# Patient Record
Sex: Male | Born: 1945 | Race: White | Hispanic: No | Smoking: Former smoker
Health system: Southern US, Community
[De-identification: ages and names within clinical notes are randomized; demographics above are authoritative.]

## PROBLEM LIST (undated history)

## (undated) DIAGNOSIS — I509 Heart failure, unspecified: Secondary | ICD-10-CM

## (undated) DIAGNOSIS — J8410 Pulmonary fibrosis, unspecified: Secondary | ICD-10-CM

## (undated) DIAGNOSIS — I10 Essential (primary) hypertension: Secondary | ICD-10-CM

## (undated) DIAGNOSIS — E785 Hyperlipidemia, unspecified: Secondary | ICD-10-CM

## (undated) DIAGNOSIS — R0602 Shortness of breath: Secondary | ICD-10-CM

## (undated) DIAGNOSIS — I2510 Atherosclerotic heart disease of native coronary artery without angina pectoris: Secondary | ICD-10-CM

## (undated) HISTORY — DX: Essential (primary) hypertension: I10

## (undated) HISTORY — DX: Atherosclerotic heart disease of native coronary artery without angina pectoris: I25.10

## (undated) HISTORY — PX: CARDIAC CATHETERIZATION: SHX172

## (undated) HISTORY — DX: Pulmonary fibrosis, unspecified: J84.10

## (undated) HISTORY — DX: Hyperlipidemia, unspecified: E78.5

---

## 2007-04-08 ENCOUNTER — Ambulatory Visit: Payer: Self-pay | Admitting: Critical Care Medicine

## 2007-04-08 LAB — CONVERTED CEMR LAB
Angiotensin 1 Converting Enzyme: 4 units/L — ABNORMAL LOW (ref 9–67)
Anti Nuclear Antibody(ANA): NEGATIVE
Sed Rate: 30 mm/hr — ABNORMAL HIGH (ref 0–20)

## 2007-04-09 ENCOUNTER — Ambulatory Visit (HOSPITAL_COMMUNITY): Admission: RE | Admit: 2007-04-09 | Discharge: 2007-04-09 | Payer: Self-pay | Admitting: Critical Care Medicine

## 2007-04-14 ENCOUNTER — Ambulatory Visit: Payer: Self-pay | Admitting: Critical Care Medicine

## 2007-04-14 ENCOUNTER — Ambulatory Visit: Admission: RE | Admit: 2007-04-14 | Discharge: 2007-04-14 | Payer: Self-pay | Admitting: Critical Care Medicine

## 2007-04-14 ENCOUNTER — Encounter: Payer: Self-pay | Admitting: Critical Care Medicine

## 2007-04-23 ENCOUNTER — Ambulatory Visit: Payer: Self-pay | Admitting: Critical Care Medicine

## 2007-04-26 DIAGNOSIS — I10 Essential (primary) hypertension: Secondary | ICD-10-CM | POA: Insufficient documentation

## 2007-04-26 DIAGNOSIS — E785 Hyperlipidemia, unspecified: Secondary | ICD-10-CM

## 2007-05-28 ENCOUNTER — Ambulatory Visit: Payer: Self-pay | Admitting: Critical Care Medicine

## 2010-02-20 ENCOUNTER — Encounter: Payer: Self-pay | Admitting: Critical Care Medicine

## 2010-02-26 ENCOUNTER — Ambulatory Visit: Payer: Self-pay | Admitting: Thoracic Surgery (Cardiothoracic Vascular Surgery)

## 2010-03-15 ENCOUNTER — Ambulatory Visit (HOSPITAL_BASED_OUTPATIENT_CLINIC_OR_DEPARTMENT_OTHER): Admission: RE | Admit: 2010-03-15 | Discharge: 2010-03-15 | Payer: Self-pay | Admitting: Critical Care Medicine

## 2010-03-15 ENCOUNTER — Ambulatory Visit: Payer: Self-pay | Admitting: Diagnostic Radiology

## 2010-03-15 ENCOUNTER — Ambulatory Visit: Payer: Self-pay | Admitting: Critical Care Medicine

## 2010-03-15 DIAGNOSIS — J8410 Pulmonary fibrosis, unspecified: Secondary | ICD-10-CM

## 2010-03-15 DIAGNOSIS — I2510 Atherosclerotic heart disease of native coronary artery without angina pectoris: Secondary | ICD-10-CM | POA: Insufficient documentation

## 2010-03-16 ENCOUNTER — Encounter: Payer: Self-pay | Admitting: Critical Care Medicine

## 2010-03-16 ENCOUNTER — Telehealth: Payer: Self-pay | Admitting: Critical Care Medicine

## 2010-03-19 ENCOUNTER — Ambulatory Visit: Payer: Self-pay | Admitting: Critical Care Medicine

## 2010-03-20 ENCOUNTER — Encounter: Payer: Self-pay | Admitting: Critical Care Medicine

## 2010-04-30 ENCOUNTER — Telehealth: Payer: Self-pay | Admitting: Critical Care Medicine

## 2010-05-08 ENCOUNTER — Ambulatory Visit: Payer: Self-pay | Admitting: Internal Medicine

## 2010-05-24 ENCOUNTER — Ambulatory Visit: Payer: Self-pay | Admitting: Internal Medicine

## 2010-06-12 ENCOUNTER — Telehealth: Payer: Self-pay | Admitting: Critical Care Medicine

## 2010-07-05 ENCOUNTER — Ambulatory Visit: Payer: Self-pay | Admitting: Critical Care Medicine

## 2010-07-17 ENCOUNTER — Telehealth (INDEPENDENT_AMBULATORY_CARE_PROVIDER_SITE_OTHER): Payer: Self-pay | Admitting: *Deleted

## 2010-07-23 ENCOUNTER — Telehealth (INDEPENDENT_AMBULATORY_CARE_PROVIDER_SITE_OTHER): Payer: Self-pay | Admitting: *Deleted

## 2010-08-13 ENCOUNTER — Encounter: Payer: Self-pay | Admitting: Critical Care Medicine

## 2010-08-30 ENCOUNTER — Encounter: Payer: Self-pay | Admitting: Internal Medicine

## 2010-09-04 ENCOUNTER — Encounter: Payer: Self-pay | Admitting: Critical Care Medicine

## 2010-09-04 NOTE — Progress Notes (Signed)
Summary: CT Chest Result  Phone Note Outgoing Call   Reason for Call: Discuss lab or test results Summary of Call: I called pt and told him results of the CT Chest I called in 5days of Avelox 400mg  /d to [facility].  Pt is aware Initial call taken by: Storm Frisk MD,  March 16, 2010 2:28 PM

## 2010-09-04 NOTE — Miscellaneous (Signed)
Summary: Orders Update pft charges  Clinical Lists Changes  Orders: Added new Service order of Carbon Monoxide diffusing w/capacity (94720) - Signed Added new Service order of Lung Volumes (94240) - Signed Added new Service order of Spirometry (Pre & Post) (94060) - Signed 

## 2010-09-04 NOTE — Progress Notes (Signed)
Summary: nos appt  Phone Note Call from Patient   Caller: [email or web address]  Call For: wright Summary of Call: LMTCB x2 to rsc nos from 9/22. Initial call taken by: Darletta Moll,  April 30, 2010 10:31 AM

## 2010-09-04 NOTE — Progress Notes (Signed)
Summary: ov needed  Phone Note Outgoing Call   Reason for Call: Confirm/change Appt Summary of Call: needs ov in [place] office soon  Follow-up for Phone Call        Called, spoke with pt.  He was informed per PW, he needs OV soon.  Pt stated he could not do it before dec 1.  OV scheduled with PW on 12.1.11 at 9am in [place] - pt aware.  Gweneth Dimitri RN  June 12, 2010 5:15 PM

## 2010-09-04 NOTE — Miscellaneous (Signed)
Summary: PFTs   Pulmonary Function Test Date: 03/19/2010 Height (in.): 70 Gender: Male  Pre-Spirometry FVC    Value: 1.93 L/min   Pred: 4.49 L/min     % Pred: 43 % FEV1    Value: 1.61 L     Pred: 3.10 L     % Pred: 52 % FEV1/FVC  Value: 84 %     Pred: 70 %    FEF 25-75  Value: 2.17 L/min   Pred: 2.88 L/min     % Pred: 76 %  Post-Spirometry FVC    Value: 1.93 L/min   Pred: 4.49 L/min     % Pred: 43 % FEV1    Value: 1.63 L     Pred: 3.10 L     % Pred: 53 % FEV1/FVC  Value: 85 %     Pred: 70 %    FEF 25-75  Value: 2.19 L/min   Pred: 2.88 L/min     % Pred: 76 %  Lung Volumes TLC    Value: 3.21 L   % Pred: 49 % RV    Value: 1.26 L   % Pred: 51 % DLCO    Value: 12.6 %   % Pred: 48 % DLCO/VA  Value: 3.83 %   % Pred: 106 %  Comments: Moderate restrictive defect Clinical Lists Changes  Observations: Added new observation of PFT COMMENTS: Moderate restrictive defect (03/20/2010 10:30) Added new observation of DLCO/VA%EXP: 106 % (03/20/2010 10:30) Added new observation of DLCO/VA: 3.83 % (03/20/2010 10:30) Added new observation of DLCO % EXPEC: 48 % (03/20/2010 10:30) Added new observation of DLCO: 12.6 % (03/20/2010 10:30) Added new observation of RV % EXPECT: 51 % (03/20/2010 10:30) Added new observation of RV: 1.26 L (03/20/2010 10:30) Added new observation of TLC % EXPECT: 49 % (03/20/2010 10:30) Added new observation of TLC: 3.21 L (03/20/2010 10:30) Added new observation of FEF2575%EXPS: 76 % (03/20/2010 10:30) Added new observation of PSTFEF25/75P: 2.88  (03/20/2010 10:30) Added new observation of PSTFEF25/75%: 2.19 L/min (03/20/2010 10:30) Added new observation of FEV1FVCPRDPS: 70 % (03/20/2010 10:30) Added new observation of PSTFEV1/FVC: 85 % (03/20/2010 10:30) Added new observation of POSTFEV1%PRD: 53 % (03/20/2010 10:30) Added new observation of FEV1PRDPST: 3.10 L (03/20/2010 10:30) Added new observation of POST FEV1: 1.63 L/min (03/20/2010 10:30) Added new observation  of POST FVC%EXP: 43 % (03/20/2010 10:30) Added new observation of FVCPRDPST: 4.49 L/min (03/20/2010 10:30) Added new observation of POST FVC: 1.93 L (03/20/2010 10:30) Added new observation of FEF % EXPEC: 76 % (03/20/2010 10:30) Added new observation of FEF25-75%PRE: 2.88 L/min (03/20/2010 10:30) Added new observation of FEF 25-75%: 2.17 L/min (03/20/2010 10:30) Added new observation of FEV1/FVC PRE: 70 % (03/20/2010 10:30) Added new observation of FEV1/FVC: 84 % (03/20/2010 10:30) Added new observation of FEV1 % EXP: 52 % (03/20/2010 10:30) Added new observation of FEV1 PREDICT: 3.10 L (03/20/2010 10:30) Added new observation of FEV1: 1.61 L (03/20/2010 10:30) Added new observation of FVC % EXPECT: 43 % (03/20/2010 10:30) Added new observation of FVC PREDICT: 4.49 L (03/20/2010 10:30) Added new observation of FVC: 1.93 L (03/20/2010 10:30) Added new observation of PFT HEIGHT: 70  (03/20/2010 10:30) Added new observation of PFT DATE: 03/19/2010  (03/20/2010 10:30)  Appended Document: PFTs result noted  patient aware

## 2010-09-04 NOTE — Assessment & Plan Note (Signed)
Summary: SIX MIN WALK-PULM STRESS TEST  Nurse Visit   Vital Signs:  Patient profile:   65 year old male Pulse rate:   72 / minute BP sitting:   140 / 72  Medications Prior to Update: 1)  Atenolol 100 Mg  Tabs (Atenolol) .... One By Mouth Once Daily 2)  Simvastatin 40 Mg Tabs (Simvastatin) .... Take 1 Tab By Mouth At Bedtime 3)  Folbee Plus   Tabs (B Complex-C-Folic Acid) .... One By Mouth Once Daily 4)  Lisinopril 20 Mg Tabs (Lisinopril) .... Take 1 Tablet By Mouth Once A Day 5)  Diovan Hct 320-25 Mg  Tabs (Valsartan-Hydrochlorothiazide) .... One By Mouth Once Daily 6)  Aspirin 325 Mg  Tabs (Aspirin) .... Take 1 Tablet By Mouth Once A Day 7)  Fish Oil 300 Mg Caps (Omega-3 Fatty Acids) .... Take 2 Capsule By Mouth Once A Day 8)  Omega-3 Krill Oil 300 Mg Caps (Krill Oil) .... Take 2 Capsule By Mouth Once A Day 9)  Glucosamine-Chondroitin  Caps (Glucosamine-Chondroit-Vit C-Mn) .... Take 2 Tablet By Mouth Once A Day 10)  Nac 600 600 Mg Caps (Acetylcysteine) .... Take 1 Tablet By Mouth Every Morning 11)  Multivitamins   Tabs (Multiple Vitamin) .... Take 1 Tablet By Mouth Once A Day 12)  Avelox 400 Mg  Tabs (Moxifloxacin Hcl) .... By Mouth Daily  Allergies: No Known Drug Allergies  Orders Added: 1)  Pulmonary Stress (6 min walk) [94620]   Six Minute Walk Test Medications taken before test(dose and time): 1)  Atenolol 100 Mg  Tabs (Atenolol) .... One By Mouth Once Daily 3)  Folbee Plus   Tabs (B Complex-C-Folic Acid) .... One By Mouth Once Daily 4)  Lisinopril 20 Mg Tabs (Lisinopril) .... Take 1 Tablet By Mouth Once A Day 5)  Diovan Hct 320-25 Mg  Tabs (Valsartan-Hydrochlorothiazide) .... One By Mouth Once Daily 6)  Aspirin 325 Mg  Tabs (Aspirin) .... Take 1 Tablet By Mouth Once A Day 7)  Fish Oil 300 Mg Caps (Omega-3 Fatty Acids) .... Take 2 Capsule By Mouth Once A Day 8)  Omega-3 Krill Oil 300 Mg Caps (Krill Oil) .... Take 2 Capsule By Mouth Once A Day 9)  Glucosamine-Chondroitin   Caps (Glucosamine-Chondroit-Vit C-Mn) .... Take 2 Tablet By Mouth Once A Day 10)  Nac 600 600 Mg Caps (Acetylcysteine) .... Take 1 Tablet By Mouth Every Morning 11)  Multivitamins   Tabs (Multiple Vitamin) .... Take 1 Tablet By Mouth Once A Day PT TOOK ALL THESE MEDS AT 5:30 AM (STILL TAKING AVELOX BUT AT BEDTIME PER PT) Supplemental oxygen during the test: No  Lap counter(place a tick mark inside a square for each lap completed) lap 1 complete  lap 2 complete   lap 3 complete   lap 4 complete  lap 5 complete  lap 6 complete   Baseline  BP sitting: 140/ 72 Heart rate: 72 Dyspnea ( Borg scale) 0 Fatigue (Borg scale) 0 SPO2 97  End Of Test  BP sitting: 148/ 82 Heart rate: 88 Dyspnea ( Borg scale) 4 Fatigue (Borg scale) 0 SPO2 89  2 Minutes post  BP sitting: 146/ 80 Heart rate: 78 SPO2 98  Stopped or paused before six minutes? No  Interpretation: Number of laps  6 X 48 meters =   288 meters+ final partial lap: 24 meters =    312 meters   Total distance walked in six minutes: 312 meters  Tech ID: Tivis Ringer, CNA (March 19, 2010 9:24 AM) Tech Comments Pt completed test w/ 0 rest breaks and 1 complaint- SOB which resolved at 2 mins. post.

## 2010-09-04 NOTE — Assessment & Plan Note (Signed)
Summary: Pulmonary OV   Copy to:  PCP Primary Provider/Referring Provider:  Dr. Gildardo Griffes - pmd. dR. Shan Levans - Pulm  CC:  Follow up.  Pt states breathing seems to be improved some since starting pulm rehab.  States he does have SOB with exertion, wheezing at times, and cough.   Cough is prod at times with clear mucus..  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  May 08, 2010: IPF patient. Pre-study screen visit for ASCEND study. He confirms he is off NAC. Several weeks ago had rt knee effusion tappeed by an orthpoid in [employer]. Now well. Back to exercising. Has baseline class 2-3 dyspnea. No change in that. No change in mild baseline cough. Denies edema or new complaints.    July 05, 2010 9:19 AM The pt has started out pt rehab.  The pt did not qualify for Ascend Dyspnea is sl better.  Dietary changes . Uses fish oil and has no GI side effect.  No heartburn.  No real cough.    Current Medications (verified): 1)  Atenolol 100 Mg  Tabs (Atenolol) .... One By Mouth Once Daily 2)  Simvastatin 40 Mg Tabs (Simvastatin) .... Take 1 Tab By Mouth At Bedtime 3)  Folbee Plus   Tabs (B Complex-C-Folic Acid) .... One By Mouth Once Daily 4)  Lisinopril-Hydrochlorothiazide 20-25 Mg Tabs (Lisinopril-Hydrochlorothiazide) .... Take 1 Tablet By Mouth Once A Day 5)  Amlodipine Besylate 5 Mg Tabs (Amlodipine Besylate) .... Take 1 Tablet By Mouth Once A Day 6)  Aspirin 325 Mg  Tabs (Aspirin) .... Take 1 Tablet By Mouth Once A Day 7)  Fish Oil 300 Mg Caps (Omega-3 Fatty Acids) .... Take 2 Capsule By Mouth Once A Day 8)  Omega-3 Krill Oil 300 Mg Caps (Krill Oil) .... Take 2 Capsule By Mouth Once A Day 9)  Glucosamine-Chondroitin  Caps (Glucosamine-Chondroit-Vit C-Mn) .... Take 2 Tablet By Mouth Once A Day 10)  Nac 600 600 Mg Caps (Acetylcysteine) .... Take 1 Capsule By Mouth Three Times A Day 11)  Multivitamins   Tabs (Multiple Vitamin) .... Take 1 Tablet By  Mouth Once A Day 12)  Lasix 20 Mg Tabs (Furosemide) .... As Needed  Allergies (verified): No Known Drug Allergies  Past History:  Past medical, surgical, family and social histories (including risk factors) reviewed, and no changes noted (except as noted below).  Past Medical History: Reviewed history from 03/15/2010 and no changes required. Hyperlipidemia Hypertension Coronary Heart Disease Pulmonary Fibrosis  Past Surgical History: Reviewed history from 03/15/2010 and no changes required. Heart cath x 3(1988, 2000 2011)  Clinical Reports Reviewed:  PFT's:  03/20/2010: DLCO %Predicted:  48 FEF 25/75 %Predicted:  76 FEV1 %Predicted:  52 FVC %Predicted:  43 Post Spirometry FEF 25/75 %Predicted:  76 Post Spirometry FEV1 %Predicted:  53 Post Spirometry FVC %Predicted:  43 RV %Predicted:  51 TLC %Predicted:  49  CT of Chest:  03/15/2010: CT of Chest:  IMPRESSION:   1.  Extensive pulmonary fibrosis.  There may be a small area of active alveolitis in the lingula, although the majority of the disease appears chronic. 2.  No mass or infiltrate identified. 3.  Nonspecific prominent mildly enlarged mediastinal lymph nodes. 4.  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  Small gallstone.     Family History: Reviewed history from 03/15/2010 and no changes required. none  Social History: Reviewed history from 03/15/2010 and no changes required. 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  1ppd x 20 yrs.  Review of Systems       The patient complains of shortness of breath with activity.  The patient denies shortness of breath at rest, productive cough, non-productive cough, coughing up blood, chest pain, irregular heartbeats, acid heartburn, indigestion, loss of appetite, weight change, abdominal pain, difficulty swallowing, sore throat, tooth/dental problems, headaches,  nasal congestion/difficulty breathing through nose, sneezing, itching, ear ache, anxiety, depression, hand/feet swelling, joint stiffness or pain, rash, change in color of mucus, and fever.    Vital Signs:  Patient profile:   65 year old male Height:      70 inches Weight:      235 pounds BMI:     33.84 O2 Sat:      94 % on Room air Temp:     98.1 degrees F oral Pulse rate:   72 / minute BP sitting:   130 / 60  (left arm) Cuff size:   large  Vitals Entered By: Gweneth Dimitri RN (July 05, 2010 9:04 AM)  O2 Flow:  Room air CC: Follow up.  Pt states breathing seems to be improved some since starting pulm rehab.  States he does have SOB with exertion, wheezing at times, cough.   Cough is prod at times with clear mucus. Comments Medications reviewed with patient Daytime contact number verified with patient. Gweneth Dimitri RN  July 05, 2010 9:05 AM    Physical Exam  Additional Exam:  Gen: Pleasant,  obese, WM  in no distress,  normal affect ENT: No lesions,  mouth clear,  oropharynx clear, no postnasal drip Neck: No JVD, no TMG, no carotid bruits Lungs: No use of accessory muscles, no dullness to percussion bibasilar dry rales Cardiovascular: RRR, heart sounds normal, no murmur or gallops, no peripheral edema Abdomen: soft and NT, no HSM,  BS normal Musculoskeletal: No deformities, no cyanosis or clubbing Neuro: alert, non focal Skin: Warm, no lesions or rashes    Impression & Recommendations:  Problem # 1:  PULMONARY FIBROSIS (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 on NAC. Pt did not qualify for Ascend due to low DLCO plan weight loss, repeat DLCO/ TLC later, reattempt Ascend trial start pulse prednisone and azathioprine 150mg /d f/u 2 months  Medications Added to Medication List This Visit: 1)  Lisinopril-hydrochlorothiazide 20-25 Mg Tabs (Lisinopril-hydrochlorothiazide) .... Take 1 tablet by mouth once a day 2)  Amlodipine Besylate 5 Mg Tabs (Amlodipine besylate)  .... Take 1 tablet by mouth once a day 3)  Nac 600 600 Mg Caps (Acetylcysteine) .... Take 1 capsule by mouth three times a day 4)  Lasix 20 Mg Tabs (Furosemide) .... As needed 5)  Prednisone 10 Mg Tabs (Prednisone) .... Take as directed 4 each am x 4 days, 3 x 4 days, 2 x 4 days, then 1 a day and stay 6)  Azathioprine 50 Mg Tabs (Azathioprine) .... Three by mouth daily  Complete Medication List: 1)  Atenolol 100 Mg Tabs (Atenolol) .... One by mouth once daily 2)  Simvastatin 40 Mg Tabs (Simvastatin) .... Take 1 tab by mouth at bedtime 3)  Folbee Plus Tabs (B complex-c-folic acid) .... One by mouth once daily 4)  Lisinopril-hydrochlorothiazide 20-25 Mg Tabs (Lisinopril-hydrochlorothiazide) .... Take 1 tablet by mouth once a day 5)  Amlodipine Besylate 5 Mg Tabs (Amlodipine besylate) .... Take 1 tablet by mouth once a day 6)  Aspirin 325 Mg  Tabs (Aspirin) .... Take 1 tablet by mouth once a day 7)  Fish Oil 300 Mg Caps (Omega-3 fatty acids) .... Take 2 capsule by mouth once a day 8)  Omega-3 Krill Oil 300 Mg Caps (Krill oil) .... Take 2 capsule by mouth once a day 9)  Glucosamine-chondroitin Caps (Glucosamine-chondroit-vit c-mn) .... Take 2 tablet by mouth once a day 10)  Nac 600 600 Mg Caps (Acetylcysteine) .... Take 1 capsule by mouth three times a day 11)  Multivitamins Tabs (Multiple vitamin) .... Take 1 tablet by mouth once a day 12)  Lasix 20 Mg Tabs (Furosemide) .... As needed 13)  Prednisone 10 Mg Tabs (Prednisone) .... Take as directed 4 each am x 4 days, 3 x 4 days, 2 x 4 days, then 1 a day and stay 14)  Azathioprine 50 Mg Tabs (Azathioprine) .... Three by mouth daily  Other Orders: Est. Patient Level IV (16109)  Patient Instructions: 1)  You may take NAC, two am ,  one pm 2)  Start azathioprine three daily 3)  Start Prednisone 10mg  4 each am x 4 days, 3 x 4 days, 2 x 4 days, then one daily and stay 4)  Return 2 months for recheck and labs (liver function tests,  CBC) Prescriptions: AZATHIOPRINE 50 MG TABS (AZATHIOPRINE) Three by mouth daily  #90 x 6   Entered and Authorized by:   Storm Frisk MD   Signed by:   Storm Frisk MD on 07/05/2010   Method used:   Electronically to        [facility] (retail)       [phone] [address]       [place], [place]  [id number]       Ph: [phone]       Fax: [phone]   RxID:   [id number] PREDNISONE 10 MG  TABS (PREDNISONE) Take as directed 4 each am x 4 days, 3 x 4 days, 2 x 4 days, then 1 a day and stay  #100 x 6   Entered and Authorized by:   Storm Frisk MD   Signed by:   Storm Frisk MD on 07/05/2010   Method used:   Electronically to        [facility] (retail)       [phone] [address]       [place], [place]  [id number]       Ph: [phone]       Fax: [phone]   RxID:   [id number]    Immunization History:  Influenza Immunization History:    Influenza:  historical (05/05/2010)   Appended Document: Pulmonary OV fax kevin burns

## 2010-09-04 NOTE — Assessment & Plan Note (Signed)
Summary: study pt//td   Visit Type:  Follow-up - ASCEND RESEARCH STUDY VISIT Copy to:  PCP Primary Provider/Referring Provider:  Dr. Gildardo Griffes - pmd. dR. Shan Levans - Pulm   History of Present Illness: May 08, 2010: IPF patient. Pre-study screen visit for ASCEND study. He confirms he is off NAC. Several weeks ago had rt knee effusion tappeed by an orthpoid in [employer]. Now well. Back to exercising. Has baseline class 2-3 dyspnea. No change in that. No change in mild baseline cough. Denies edema or new complaints.    Preventive Screening-Counseling & Management  Alcohol-Tobacco     Alcohol drinks/day: 0     Smoking Status: quit     Packs/Day: 1.0     Year Started: 1968     Year Quit: 1988     Pack years: 10  Allergies: No Known Drug Allergies  Past History:  Past medical, surgical, family and social histories (including risk factors) reviewed, and no changes noted (except as noted below).  Past Medical History: Reviewed history from 03/15/2010 and no changes required. Hyperlipidemia Hypertension Coronary Heart Disease Pulmonary Fibrosis  Past Surgical History: Reviewed history from 03/15/2010 and no changes required. Heart cath x 8(8416, 2000 2011)  Family History: Reviewed history from 03/15/2010 and no changes required. none  Social History: Reviewed history from 03/15/2010 and no changes required. 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   Review of Systems       The patient complains of dyspnea on exertion.  The patient denies anorexia, fever, weight loss, weight gain, vision loss, decreased hearing, hoarseness, chest pain, syncope, peripheral edema, prolonged cough, headaches, hemoptysis, abdominal pain, melena, hematochezia, severe indigestion/heartburn, hematuria, incontinence, genital sores, muscle weakness, suspicious skin lesions, transient blindness, difficulty walking,  depression, unusual weight change, abnormal bleeding, enlarged lymph nodes, angioedema, breast masses, and testicular masses.    Vital Signs:  Patient profile:   65 year old male Weight:      234 pounds BMI:     33.70 O2 Sat:      93 % on Room air Pulse rate:   92 / minute BP supine:   160 / 80  O2 Flow:  Room air  Physical Exam  General:  healthy appearing and obese.  healthy appearing.   Head:  normocephalic and atraumatic Eyes:  PERRLA/EOM intact; conjunctiva and sclera clear Ears:  TMs intact and clear with normal canals Nose:  no deformity, discharge, inflammation, or lesions Mouth:  no deformity or lesions Neck:  no masses, thyromegaly, or abnormal cervical nodes Chest Wall:  no deformities noted Lungs:  coarse velcro crackles.  2/3 way up in back no distress coarse velcro crackles.   Heart:  regular rate and rhythm, S1, S2 without murmurs, rubs, gallops, or clicks Abdomen:  bowel sounds positive; abdomen soft and non-tender without masses, or organomegaly Msk:  no deformity or scoliosis noted with normal posture both knees mildly warm Pulses:  pulses normal Extremities:  no clubbing, cyanosis, edema, or deformity noted Neurologic:  CN II-XII grossly intact with normal reflexes, coordination, muscle strength and tone Skin:  intact without lesions or rashes Cervical Nodes:  no significant adenopathy Axillary Nodes:  no significant adenopathy Psych:  alert and cooperative; normal mood and affect; normal attention span and concentration   Impression & Recommendations:  Problem # 1:  PULMONARY FIBROSIS (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 Off NAC. Stable disease.   plan proceed with ASCEND Trial -  quiestion on side effects of drugs answered refer pulmnary rehab at [facility]  Other Orders: Rehabilitation Referral (Rehab) No Charge Patient Arrived (NCPA0) (NCPA0)  Patient Instructions: 1)  fu per Dr. Delford Field notes

## 2010-09-04 NOTE — Miscellaneous (Signed)
Summary: Rx Avelox  Clinical Lists Changes  Medications: Added new medication of AVELOX 400 MG  TABS (MOXIFLOXACIN HCL) By mouth daily - Signed Rx of AVELOX 400 MG  TABS (MOXIFLOXACIN HCL) By mouth daily;  #5 x 0;  Signed;  Entered by: Storm Frisk MD;  Authorized by: Storm Frisk MD;  Method used: Electronically to [facility] [phone] [address], [place], [place], [place]  [id number], Ph: [phone], Fax: [phone]    Prescriptions: AVELOX 400 MG  TABS (MOXIFLOXACIN HCL) By mouth daily  #5 x 0   Entered and Authorized by:   Storm Frisk MD   Signed by:   Storm Frisk MD on 03/16/2010   Method used:   Electronically to        [facility] (retail)       [phone] [address]       [place], [place]  [id number]       Ph: [phone]       Fax: [phone]   RxID:   [id number]

## 2010-09-04 NOTE — Assessment & Plan Note (Signed)
Summary: Pulmonary Consultation   Visit Type:  Initial Consult Copy to:  PCP Primary Provider/Referring Provider:  Dr. Gildardo Griffes  CC:  Pt here to become re-established and re-evaluation.  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  Last seen by PW 05/2007.  Pt was on prednisone 10mg /d and NAC three times a day.   Pt stable over time until July 2011,  developed more edema and admitted to [facility].  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  Pt had lisinopril increased to 20mg /d and lasix continued.  No real cough. Pt notes just dyspnea with exertion.  Pred was pulsed and then tapered to off.  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  Now only taking NAC 600mg /d.   When on prednisone did not notice much change in breathing.  No  recent CT chest or PFT. Here to reestablish for pulm f/u.  Pt denies any significant sore throat, nasal congestion or excess secretions, fever, chills, sweats, unintended weight loss, pleurtic or exertional chest pain, orthopnea PND, or leg swelling Pt denies any increase in rescue therapy over baseline, denies waking up needing it or having any early am or nocturnal exacerbations of coughing/wheezing/or dyspnea.   Preventive Screening-Counseling & Management  Alcohol-Tobacco     Alcohol drinks/day: 0     Smoking Status: quit     Packs/Day: 1.0     Year Started: 1968     Year Quit: 1988  Current Medications (verified): 1)  Atenolol 100 Mg  Tabs (Atenolol) .... One By Mouth Once Daily 2)  Simvastatin 40 Mg Tabs (Simvastatin) .... Take 1 Tab By Mouth At Bedtime 3)  Folbee Plus   Tabs (B Complex-C-Folic Acid) .... One By Mouth Once Daily 4)  Lisinopril 20 Mg Tabs (Lisinopril) .... Take 1 Tablet By Mouth Once A Day 5)  Diovan Hct 320-25 Mg  Tabs (Valsartan-Hydrochlorothiazide) .... One By Mouth Once Daily 6)  Aspirin 325 Mg  Tabs (Aspirin) .... Take 1 Tablet By Mouth Once A Day 7)  Fish Oil 300  Mg Caps (Omega-3 Fatty Acids) .... Take 2 Capsule By Mouth Once A Day 8)  Omega-3 Krill Oil 300 Mg Caps (Krill Oil) .... Take 2 Capsule By Mouth Once A Day 9)  Glucosamine-Chondroitin  Caps (Glucosamine-Chondroit-Vit C-Mn) .... Take 2 Tablet By Mouth Once A Day 10)  Nac 600 600 Mg Caps (Acetylcysteine) .... Take 1 Tablet By Mouth Every Morning 11)  Multivitamins   Tabs (Multiple Vitamin) .... Take 1 Tablet By Mouth Once A Day  Allergies (verified): No Known Drug Allergies  Past History:  Past medical, surgical, family and social histories (including risk factors) reviewed, and no changes noted (except as noted below).  Past Medical History: Hyperlipidemia Hypertension Coronary Heart Disease Pulmonary Fibrosis  Past Surgical History: Heart cath x 1(3086, 2000 2011)  Family History: Reviewed history and no changes required. none  Social History: Reviewed history and no changes required. 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  Packs/Day:  1.0 Alcohol drinks/day:  0  Review of Systems       The patient complains of shortness of breath with activity, productive cough, non-productive cough, weight change, and joint stiffness or pain.  The patient denies shortness of breath at rest, coughing up blood, chest pain, irregular heartbeats, acid heartburn, indigestion, loss of appetite, abdominal pain, difficulty swallowing, sore throat, tooth/dental problems, headaches, nasal congestion/difficulty breathing  through nose, sneezing, itching, ear ache, anxiety, depression, hand/feet swelling, rash, change in color of mucus, and fever.    Vital Signs:  Patient profile:   65 year old male Height:      70 inches Weight:      240 pounds BMI:     34.56 O2 Sat:      93 % on Room air Temp:     97.7 degrees F oral Pulse rate:   74 / minute BP sitting:   150 / 92  (left arm) Cuff size:   regular  Vitals Entered By:  Zackery Barefoot CMA (March 15, 2010 9:31 AM)  O2 Flow:  Room air CC: Pt here to become re-established and re-evaluation Comments Medications reviewed with patient Verified contact number and pharmacy with patient Zackery Barefoot CMA  March 15, 2010 9:32 AM    Physical Exam  Additional Exam:  Gen: Pleasant,  obese, WM  in no distress,  normal affect ENT: No lesions,  mouth clear,  oropharynx clear, no postnasal drip Neck: No JVD, no TMG, no carotid bruits Lungs: No use of accessory muscles, no dullness to percussion bibasilar dry rales Cardiovascular: RRR, heart sounds normal, no murmur or gallops, no peripheral edema Abdomen: soft and NT, no HSM,  BS normal Musculoskeletal: No deformities, no cyanosis or clubbing Neuro: alert, non focal Skin: Warm, no lesions or rashes    CT of Chest  Procedure date:  03/15/2010  Findings:      IMPRESSION:   1.  Extensive pulmonary fibrosis.  There may be a small area of active alveolitis in the lingula, although the majority of the disease appears chronic. 2.  No mass or infiltrate identified. 3.  Nonspecific prominent mildly enlarged mediastinal lymph nodes. 4.  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  Small gallstone.    Impression & Recommendations:  Problem # 1:  PULMONARY FIBROSIS (ICD-515) Assessment Unchanged Idiopathic pulmonary fibrosis  no evidence for autoimmune disease,  no prior benefit from steroids.   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 plan Full PFTs and 6 min walk increase N acetyl cysteine to 600mg  three times a day consider for pirfenidone trial which we are involved and will start enrolling in two weeks I will call pt with details about the clinical trial , he did express interest in participatinjg  Medications Added to Medication List This Visit: 1)  Simvastatin 40 Mg Tabs (Simvastatin) .... Take 1  tab by mouth at bedtime 2)  Lisinopril 20 Mg Tabs (Lisinopril) .... Take 1 tablet by mouth once a day 3)  Aspirin 325 Mg Tabs (Aspirin) .... Take 1 tablet by mouth once a day 4)  Fish Oil 300 Mg Caps (Omega-3 fatty acids) .... Take 2 capsule by mouth once a day 5)  Omega-3 Krill Oil 300 Mg Caps (Krill oil) .... Take 2 capsule by mouth once a day 6)  Glucosamine-chondroitin Caps (Glucosamine-chondroit-vit c-mn) .... Take 2 tablet by mouth once a day 7)  Nac 600 600 Mg Caps (Acetylcysteine) .... Take 1 tablet by mouth every morning 8)  Multivitamins Tabs (Multiple vitamin) .... Take 1 tablet by mouth once a day  Complete Medication List: 1)  Atenolol 100 Mg Tabs (Atenolol) .... One by mouth once daily 2)  Simvastatin 40 Mg Tabs (Simvastatin) .... Take 1 tab by mouth at bedtime 3)  Folbee Plus Tabs (B complex-c-folic acid) .... One by mouth once daily 4)  Lisinopril 20 Mg Tabs (Lisinopril) .... Take 1 tablet by mouth once a day 5)  Diovan Hct 320-25 Mg Tabs (Valsartan-hydrochlorothiazide) .... One by mouth once daily 6)  Aspirin 325 Mg Tabs (Aspirin) .... Take 1 tablet by mouth once a day 7)  Fish Oil 300 Mg Caps (Omega-3 fatty acids) .... Take 2 capsule by mouth once a day 8)  Omega-3 Krill Oil 300 Mg Caps (Krill oil) .... Take 2 capsule by mouth once a day 9)  Glucosamine-chondroitin Caps (Glucosamine-chondroit-vit c-mn) .... Take 2 tablet by mouth once a day 10)  Nac 600 600 Mg Caps (Acetylcysteine) .... Take 1 tablet by mouth every morning 11)  Multivitamins Tabs (Multiple vitamin) .... Take 1 tablet by mouth once a day  Other Orders: New Patient Level V (16109) Pulmonary Referral (Pulmonary) Radiology Referral (Radiology)  Patient Instructions: 1)  CT chest today 2)  Pulmonary function testing to be scheduled at [facility] office 3)  Increase NAC to three times daily 4)  Return 6 weeks [place] 5)  We will screen you for a new research medication called pirfenidone, research nurse will  call you about the trial   Immunization History:  Influenza Immunization History:    Influenza:  historical (05/08/2009)   Appended Document: Pulmonary Consultation fax Gildardo Griffes and Retta Mac

## 2010-09-04 NOTE — Letter (Signed)
Summary: [facility]  [facility]   Imported By: Lennie Odor 03/20/2010 14:09:19  _____________________________________________________________________  External Attachment:    Type:   Image     Comment:   External Document

## 2010-09-06 NOTE — Progress Notes (Signed)
Summary: status of form - to be refaxed  Phone Note Other Incoming Call back at [phone] ext [phone]   Caller: cathy//nurse w [facility], [place] Summary of Call: Checking on status of form sent for home O2 and walk test. Initial call taken by: Darletta Moll,  July 17, 2010 2:49 PM  Follow-up for Phone Call        Crystal, have you seen this or know if PW has this? Pls advise, thanks! Vernie Murders  July 17, 2010 3:31 PM  do not recall seeing this form.  Not in PW's or my to do folder.   Gweneth Dimitri RN  July 17, 2010 3:47 PM  LMOMTCB Vernie Murders  July 17, 2010 3:51 PM   Additional Follow-up for Phone Call Additional follow up Details #1::        LM on named VM for [facility] - informed her that [facility] nurse has not seen this form and requested that it be faxed again to the triage fax # attn Crystal.  encourgaed her to call w/ any questions/concerns.  will sign off on message. Boone Master CNA/MA  July 18, 2010 5:14 PM

## 2010-09-06 NOTE — Progress Notes (Signed)
  Phone Note Other Incoming   Request: Send information Summary of Call: Records received from [facility] from patient's study on 07/22/2010. 3 pages forwarded to Dr. Lelon Perla for review.

## 2010-09-12 NOTE — Miscellaneous (Signed)
Summary: Requests Oxygen/[facility]  Requests Oxygen/[facility]   Imported By: Lester [place] 09/07/2010 09:07:08  _____________________________________________________________________  External Attachment:    Type:   Image     Comment:   External Document

## 2010-09-12 NOTE — Miscellaneous (Signed)
Summary: Orders Update  Clinical Lists Changes  Orders: Added new Referral order of DME Referral (DME) - Signed 

## 2010-09-13 ENCOUNTER — Encounter: Payer: Self-pay | Admitting: Critical Care Medicine

## 2010-09-13 ENCOUNTER — Ambulatory Visit (INDEPENDENT_AMBULATORY_CARE_PROVIDER_SITE_OTHER): Payer: Medicare Other | Admitting: Critical Care Medicine

## 2010-09-13 DIAGNOSIS — R892 Abnormal level of other drugs, medicaments and biological substances in specimens from other organs, systems and tissues: Secondary | ICD-10-CM

## 2010-09-13 DIAGNOSIS — J8410 Pulmonary fibrosis, unspecified: Secondary | ICD-10-CM

## 2010-09-13 DIAGNOSIS — R945 Abnormal results of liver function studies: Secondary | ICD-10-CM | POA: Insufficient documentation

## 2010-09-14 ENCOUNTER — Telehealth: Payer: Self-pay | Admitting: Critical Care Medicine

## 2010-09-14 ENCOUNTER — Encounter: Payer: Self-pay | Admitting: Critical Care Medicine

## 2010-09-14 LAB — CONVERTED CEMR LAB
ALT: 20 units/L (ref 0–53)
AST: 25 units/L (ref 0–37)
Basophils Absolute: 0.1 10*3/uL (ref 0.0–0.1)
Basophils Relative: 0 % (ref 0–1)
Bilirubin, Direct: 0.3 mg/dL (ref 0.0–0.3)
Eosinophils Absolute: 0.4 10*3/uL (ref 0.0–0.7)
Hemoglobin: 13.2 g/dL (ref 13.0–17.0)
Indirect Bilirubin: 0.9 mg/dL (ref 0.0–0.9)
Lymphocytes Relative: 9 % — ABNORMAL LOW (ref 12–46)
Lymphs Abs: 1.1 10*3/uL (ref 0.7–4.0)
MCV: 87.1 fL (ref 78.0–100.0)
Monocytes Relative: 7 % (ref 3–12)
WBC: 12.1 10*3/uL — ABNORMAL HIGH (ref 4.0–10.5)

## 2010-09-20 NOTE — Procedures (Signed)
Summary: 6MWT/[facility]  6MWT/[facility]   Imported By: Lester [place] 09/11/2010 10:51:42  _____________________________________________________________________  External Attachment:    Type:   Image     Comment:   External Document

## 2010-09-24 ENCOUNTER — Encounter: Payer: Self-pay | Admitting: Critical Care Medicine

## 2010-09-26 NOTE — Progress Notes (Signed)
Summary: Lab results  Phone Note Outgoing Call   Reason for Call: Discuss lab or test results Summary of Call: call pt and tell him all labs are ok  Initial call taken by: Storm Frisk MD,  September 14, 2010 12:58 PM  Follow-up for Phone Call        Called, spoke with pt.  He was informed of above lab results per PW and verbalized understanding. Follow-up by: Gweneth Dimitri RN,  September 18, 2010 9:00 AM

## 2010-09-26 NOTE — Assessment & Plan Note (Signed)
Summary: Pulmonary OV   Copy to:  PCP Primary Provider/Referring Provider:  Dr. Gildardo Griffes - pmd. dR. Shan Levans - Pulm  CC:  2 month follow up with labs. Pt states breathing is unchanged - still having SOB with exertion.  Occas cough with a small amount of clear mucus.  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  May 08, 2010: IPF patient. Pre-study screen visit for ASCEND study. He confirms he is off NAC. Several weeks ago had rt knee effusion tappeed by an orthpoid in [employer]. Now well. Back to exercising. Has baseline class 2-3 dyspnea. No change in that. No change in mild baseline cough. Denies edema or new complaints.    July 05, 2010 9:19 AM The pt has started out pt rehab.  The pt did not qualify for Ascend Dyspnea is sl better.  Dietary changes . Uses fish oil and has no GI side effect.  No heartburn.  No real cough.    September 13, 2010 11:54 AM No change in dyspnea.  Can go longer at work.  not as winded. No real cough.  now on oxygen.  No burping on the fish oil Pt denies any significant sore throat, nasal congestion or excess secretions, fever, chills, sweats, unintended weight loss, pleurtic or exertional chest pain, orthopnea PND, or leg swelling Pt denies any increase in rescue therapy over baseline, denies waking up needing it or having any early am or nocturnal exacerbations of coughing/wheezing/or dyspnea.   Preventive Screening-Counseling & Management  Alcohol-Tobacco     Alcohol drinks/day: 0     Smoking Status: quit     Packs/Day: 1.0     Year Started: 1968     Year Quit: 1988     Pack years: 10  Current Medications (verified): 1)  Atenolol 100 Mg  Tabs (Atenolol) .... Take 1 Tablet By Mouth Two Times A Day 2)  Lipitor 40 Mg Tabs (Atorvastatin Calcium) .... Take 1 Tab By Mouth At Bedtime 3)  Folbee Plus   Tabs (B Complex-C-Folic Acid) .... One By Mouth Once Daily 4)   Lisinopril-Hydrochlorothiazide 20-25 Mg Tabs (Lisinopril-Hydrochlorothiazide) .... Take 1 Tablet By Mouth Once A Day 5)  Amlodipine Besylate 5 Mg Tabs (Amlodipine Besylate) .... Take 1 Tablet By Mouth Once A Day 6)  Aspirin 325 Mg  Tabs (Aspirin) .... Take 1 Tablet By Mouth Once A Day 7)  Fish Oil 300 Mg Caps (Omega-3 Fatty Acids) .... Take 2 Capsule By Mouth Once A Day 8)  Omega-3 Krill Oil 300 Mg Caps (Krill Oil) .... Take 2 Capsule By Mouth Once A Day 9)  Glucosamine-Chondroitin  Caps (Glucosamine-Chondroit-Vit C-Mn) .... Take 2 Tablet By Mouth Once A Day 10)  Nac 600 600 Mg Caps (Acetylcysteine) .... 2 in Am and 1 At Bedtime 11)  Multivitamins   Tabs (Multiple Vitamin) .... Take 1 Tablet By Mouth Once A Day 12)  Lasix 20 Mg Tabs (Furosemide) .... As Needed 13)  Prednisone 10 Mg  Tabs (Prednisone) .... Take 1 Tablet By Mouth Once A Day 14)  Azathioprine 50 Mg Tabs (Azathioprine) .... Three By Mouth Daily  Allergies (verified): No Known Drug Allergies  Past History:  Past medical, surgical, family and social histories (including risk factors) reviewed, and no changes noted (except as noted below).  Past Medical History: Reviewed history from 03/15/2010 and no changes required. Hyperlipidemia Hypertension Coronary Heart Disease Pulmonary Fibrosis  Past Surgical  History: Reviewed history from 03/15/2010 and no changes required. Heart cath x 1(6109, 2000 2011)  Family History: Reviewed history from 03/15/2010 and no changes required. none  Social History: Reviewed history from 07/05/2010 and no changes required. 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  Review of Systems       The patient complains of shortness of breath with activity and non-productive cough.  The patient denies shortness of breath at rest, productive cough, coughing up blood, chest pain, irregular heartbeats,  acid heartburn, indigestion, loss of appetite, weight change, abdominal pain, difficulty swallowing, sore throat, tooth/dental problems, headaches, nasal congestion/difficulty breathing through nose, sneezing, itching, ear ache, anxiety, depression, hand/feet swelling, joint stiffness or pain, rash, change in color of mucus, and fever.    Vital Signs:  Patient profile:   65 year old male Height:      70 inches Weight:      225 pounds BMI:     32.40 O2 Sat:      95 % on 3 L/mincont Temp:     98.1 degrees F oral Pulse rate:   110 / minute BP sitting:   110 / 60  (left arm) Cuff size:   large  Vitals Entered By: Gweneth Dimitri RN (September 13, 2010 11:37 AM)  O2 Flow:  3 L/mincont CC: 2 month follow up with labs. Pt states breathing is unchanged - still having SOB with exertion.  Occas cough with a small amount of clear mucus.  Denies wheezing and chest tightness. Comments Medications reviewed with patient Daytime contact number verified with patient. Gweneth Dimitri RN  September 13, 2010 11:41 AM    Physical Exam  Additional Exam:  Gen: Pleasant,  obese, WM  in no distress,  normal affect ENT: No lesions,  mouth clear,  oropharynx clear, no postnasal drip Neck: No JVD, no TMG, no carotid bruits Lungs: No use of accessory muscles, no dullness to percussion bibasilar dry rales Cardiovascular: RRR, heart sounds normal, no murmur or gallops, no peripheral edema Abdomen: soft and NT, no HSM,  BS normal Musculoskeletal: No deformities, no cyanosis or clubbing Neuro: alert, non focal Skin: Warm, no lesions or rashes  WBC                  [H]  12.1 K/uL                   4.0-10.5   RBC                       4.56 MIL/uL                 4.22-5.81   Hemoglobin                13.2 g/dL                   60.4-54.0   Hematocrit                39.7 %                      39.0-52.0   MCV                       87.1 fL  78.0-100.0 ! MCH                       28.9 pg                      26.0-34.0   MCHC                      33.2 g/dL                   11.9-14.7   RDW                  [H]  16.1 %                      11.5-15.5   Platelet Count            293 K/uL                    150-400   Granulocyte %        [H]  80 %                        43-77   Absolute Gran        [H]  9.7 K/uL                    1.7-7.7   Lymph %              [L]  9 %                         12-46   Absolute Lymph            1.1 K/uL                    0.7-4.0   Mono %                    7 %                         3-12   Absolute Mono             0.8 K/uL                    0.1-1.0   Eos %                     3 %                         0-5   Absolute Eos              0.4 K/uL                    0.0-0.7   Baso %                    0 %                         0-1   Absolute Baso             0.1 K/uL                    0.0-0.1  Smear Review       RESULT: Criteria for review not met  Tests: (2) Liver Profile (16109)   Bilirubin, Total          1.2 mg/dL                   6.0-4.5   Bilirubin, Direct         0.3 mg/dL                   4.0-9.8   Indirect Bilirubin        0.9 mg/dL                   1.1-9.1   Alkaline Phosphatase      71 U/L                      39-117   AST/SGOT                  25 U/L                      0-37   ALT/SGPT                  20 U/L                      0-53   Total Protein             6.3 g/dL                    4.7-8.2   Albumin                   3.9 g/dL                    9.5-6.2   Impression & Recommendations:  Problem # 1:  PULMONARY FIBROSIS (ICD-515) Assessment Unchanged  IPF. on NAC., imuran/ steroids plan cotn sl pred taper  cont imuran note cbc and liver function profie normal  Medications Added to Medication List This Visit: 1)  Atenolol 100 Mg Tabs (Atenolol) .... Take 1 tablet by mouth two times a day 2)  Lipitor 40 Mg Tabs (Atorvastatin calcium) .... Take 1 tab by mouth at bedtime 3)  Nac 600 600 Mg Caps (Acetylcysteine) .... 2 in am  and 1 at bedtime 4)  Prednisone 10 Mg Tabs (Prednisone) .... Take 1 tablet by mouth once a day  Complete Medication List: 1)  Atenolol 100 Mg Tabs (Atenolol) .... Take 1 tablet by mouth two times a day 2)  Lipitor 40 Mg Tabs (Atorvastatin calcium) .... Take 1 tab by mouth at bedtime 3)  Folbee Plus Tabs (B complex-c-folic acid) .... One by mouth once daily 4)  Lisinopril-hydrochlorothiazide 20-25 Mg Tabs (Lisinopril-hydrochlorothiazide) .... Take 1 tablet by mouth once a day 5)  Amlodipine Besylate 5 Mg Tabs (Amlodipine besylate) .... Take 1 tablet by mouth once a day 6)  Aspirin 325 Mg Tabs (Aspirin) .... Take 1 tablet by mouth once a day 7)  Fish Oil 300 Mg Caps (Omega-3 fatty acids) .... Take 2 capsule by mouth once a day 8)  Omega-3 Krill Oil 300 Mg Caps (Krill oil) .... Take 2 capsule by mouth once a day 9)  Glucosamine-chondroitin Caps (Glucosamine-chondroit-vit c-mn) .... Take 2 tablet by mouth once a day 10)  Nac 600 600 Mg Caps (Acetylcysteine) .... 2 in am and 1 at  bedtime 11)  Multivitamins Tabs (Multiple vitamin) .... Take 1 tablet by mouth once a day 12)  Lasix 20 Mg Tabs (Furosemide) .... As needed 13)  Prednisone 10 Mg Tabs (Prednisone) .... Take 1 tablet by mouth once a day 14)  Azathioprine 50 Mg Tabs (Azathioprine) .... Three by mouth daily  Other Orders: TLB-CBC Platelet - w/Differential (85025-CBCD) TLB-Hepatic/Liver Function Pnl (80076-HEPATIC) Est. Patient Level III (11914)  Patient Instructions: 1)  No change in medications 2)  Return in       4   months    Appended Document: Pulmonary OV fax kevin burns

## 2010-10-02 NOTE — Letter (Signed)
Summary: CMN for Oxygen/[facility]  [facility]   Imported By: Sherian Rein 09/24/2010 12:03:07  _____________________________________________________________________  External Attachment:    Type:   Image     Comment:   External Document

## 2010-10-16 NOTE — Miscellaneous (Signed)
Summary: [facility]   Imported By: Lester [place] 10/12/2010 09:39:07  _____________________________________________________________________  External Attachment:    Type:   Image     Comment:   External Document

## 2010-12-18 NOTE — Assessment & Plan Note (Signed)
[facility]                             PULMONARY OFFICE NOTE   Joshua Booth, Joshua Booth                          MRN:          [id number]  DATE:05/28/2007                            DOB:          1946/01/05    The patient returns in follow-up and has underlying pulmonary fibrosis.  Overall, he is doing well.  He is on a prednisone 10 mg daily and  Enastel-16 600 mg t.i.d.  He has no respiratory complaints.   PHYSICAL EXAMINATION:  VITAL SIGNS:  Temperature 98, blood pressure  130/72, pulse 71, saturation 95% on room air.  CHEST:  Distant breath sounds without evidence of wheeze or rhonchi.  HEART:  Regular rate and rhythm without S3.  Normal S1 and S2.  ABDOMEN:  Soft and nontender.  EXTREMITIES:  No edema or clubbing.  SKIN:  Clear.   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   PLAN:  Maintain prednisone 10 mg daily, continue NAC 600 mg t.i.d., and  he received a flu vaccine and Pneumovax today in this office.  We will  the patient back in follow-up.     Charlcie Cradle Delford Field, MD, [facility]  Electronically Signed    PEW/MedQ  DD: 05/28/2007  DT: 05/29/2007  Job #: [id number]   cc:   Retta Mac, M.D.  Gildardo Griffes, M.D.

## 2010-12-18 NOTE — Assessment & Plan Note (Signed)
[facility]                             PULMONARY OFFICE NOTE   Joshua, Booth                          MRN:          [id number]  DATE:04/23/2007                            DOB:          1946-04-21    [name] returns in followup, 65 year old white male with bilateral  interstitial infiltrates on x-ray.  Workup to date shows on bronchoscopy  is positive for reactive and abundant histiocytes and macrophages and  focal interstitial fibrosis without granulomas or malignancy.  Bronchial  washes did grow methicillin-sensitive staph aureus, ACE level is normal,  hypersensitivity profile is negative, sed rate was elevated at 30,  rheumatoid factor less than 20.  Pulmonary functions that were obtained  showed a well-preserved diffusion capacity of 82% predicted, lung  capacity is low at 75% predicted, spirometry compatible with mild  restrictive defect.  Patient is now on systemic corticosteroids and  tapering and is improved with decreased shortness of breath.   EXAMINATION:  Temperature 97, blood pressure 138/60, pulse 86,  saturation 95% on room air.  CHEST:  Showed dry rales at the bases, otherwise clear.  CARDIAC:  Showed a regular rate and rhythm without S3, normal S1-S2.  ABDOMEN:  Soft, nontender.  EXTREMITIES:  Showed no edema or clubbing.  SKIN:  Clear.  NEUROLOGIC:  Intact.   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   PLAN:  Continue prednisone taper down to 10 mg a day and hold, initiate  NAC 600 mg three times daily.  He will obtain this at the [facility]  nutraceutical supply house.  We will continue to follow this patient  expectantly and see him in return in 3 months.     Charlcie Cradle Delford Field, MD, [facility]  Electronically Signed    PEW/MedQ  DD: 04/24/2007  DT: 04/24/2007  Job #: [id number]   cc:   Gildardo Griffes

## 2010-12-18 NOTE — Op Note (Signed)
Joshua Booth, MENNA                 ACCOUNT NO.:  [id number]   MEDICAL RECORD NO.:  [id number]          PATIENT TYPE:  AMB   LOCATION:  CARD                         FACILITY:  [facility]   PHYSICIAN:  Charlcie Cradle. Delford Field, MD, FCCPDATE OF BIRTH:  09-10-1945   DATE OF PROCEDURE:  04/14/2007  DATE OF DISCHARGE:                               OPERATIVE REPORT   PROCEDURE PERFORMED:  Bronchoscopy.   INDICATIONS:  Bilateral pulmonary infiltrates, evaluate for pulmonary  fibrosis.   OPERATOR:  Shan Levans, M.D.   ANESTHESIA:  1% Xylocaine local.   PREOPERATIVE MEDICATION:  Fentanyl 50 mcg, Versed 2 mg IV push.   DESCRIPTION OF PROCEDURE:  The Pentax video bronchoscope was introduced  via the right nares.  The upper airways visualized and unremarkable.  The entire tracheobronchial tree was inspected and revealed diffuse  tracheobronchitis without significant secretions.  Attention was then  paid to the right middle lobe.  Bronchoalveolar lavage was obtained, 80  cc infused, 40 cc returned with a reasonable return.  Attention was then  paid to the right lower lobe lateral segment.  Transbronchial biopsies  x6were obtained.  Complications were none.   IMPRESSION:  Bilateral interstitial changes.  Evaluate for pulmonary  fibrosis.   RECOMMENDATIONS:  Follow up microbiology and pathology.      Charlcie Cradle Delford Field, MD, [facility]  Electronically Signed     PEW/MEDQ  D:  04/14/2007  T:  04/15/2007  Job:  [id number]   cc:   Retta Mac, M.D.

## 2010-12-18 NOTE — Assessment & Plan Note (Signed)
[facility]                             PULMONARY OFFICE NOTE   Joshua Booth, Joshua Booth                          MRN:          [id number]  DATE:04/08/2007                            DOB:          [birth year]    PULMONARY CONSULTATION:  This is a 65 year old white male who was  referred for abnormal CT scan.  This patient was noted on a recent  physical exam to have abnormal findings on the lung exam and based on  this, an x-ray of the lung was taken showing increased interstitial  markings at the bases and a follow-up CT scan was performed showing  basilar interstitial fibrosis.  On this basis, the patient is referred  for further evaluation.  He is short of breath at rest, occasionally  coughs with a tickle in the throat.  He denies any reflux symptoms  unless he overeats.  He smoked a pack a day for 10 years, quit smoking  in 1988.  He has no previous history of pneumonia or chest pain.  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  Because of changes on the CT scan, he  was referred for further evaluation.  The patient denies any chest pain,  irregular heart beats, denies any acid indigestion, loss of appetite,  change in weight.  There has been no difficulty swallowing, sore throat,  headaches, nasal congestion, sneezing, itching, earache, anxiety, joint  stiffness or pain.   PAST MEDICAL HISTORY:  Medical:  History of hypertension, heart attack  in 1988.  No other major medical problems.  No major operative history.   MEDICATION ALLERGIES:  None.   CURRENT MEDICATIONS:  1. Atenolol 100 mg b.i.d.  2. Vytorin 10/40 mg daily.  3. Lisinopril 10 mg daily.  4. Diovan 360/25 mg daily.   On exam, this is an obese male in no distress.  Temperature 98, blood pressure is 124/80, pulse 72, saturation 94% room  air.  Weight is 260  pounds.  CHEST:  Fine Velcro rales at the bases.  No wheezes.  CARDIAC:  A regular rate and rhythm without S3, normal S1, S2.  ABDOMEN:  Soft, nontender.  EXTREMITIES:  No edema, clubbing or venous disease.  SKIN:  Clear.  NEUROLOGIC:  Intact.  HEENT:  No jugular venous distention or lymphadenopathy.  Oropharynx  clear.  NECK:  Supple.   LABORATORY DATA:  A CT scan of the chest was obtained and reviewed,  which revealed very fine bilateral interstitial changes at the bases of  the lungs in a reticular pattern, appears to be chronic.  There is also  biapical scarring and bibasilar atelectasis as well.   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  No significant risk factors identified other than the history of  smoking.   PLAN:  Pursue bronchoscopy with transbronchial biopsy.  Obtain an ANA,  sedimentation rate, hypersensitivity panel, rheumatoid factor and full  set of pulmonary function studies.  Once the results of all these are  available, further recommendations will follow.     Joshua Cradle Delford Field, MD, [facility]  Electronically Signed    PEW/MedQ  DD: 04/08/2007  DT: 04/09/2007  Job #: [id number]   cc:   Retta Mac, MD

## 2010-12-18 NOTE — Assessment & Plan Note (Signed)
[facility]                             PULMONARY OFFICE NOTE   GERSON, FAUTH                          MRN:          [id number]  DATE:04/17/2007                            DOB:          January 05, 1946    This is a note indicating findings of bronchoscopy that was performed  and biopsy abundant histiocytes, neutrophils, and early fibrosis.  Pulmonary functions return and show a diffusion capacity of 82%  predicted, total lung capacity is mildly reduced at 75% predicted,  spirometry is normal. A variety of serologies have all been obtained and  are unremarkable. The ACE level was 4. Hypersensitivity pneumonitis  panel was negative. ANA was negative. A sedimentation rate was mildly  elevated, however the rheumatoid factor was negative.   IMPRESSION:  Pulmonary fibrosis, desquamated interstitial pneumonitis  type, but only to a mild degree. Note is also made that staph aureus  methicillin-sensitive was cultured out of the airway.   PLAN:  The patient is to receive Bactrim double strength 1 twice daily  for a 5 day interval. He will begin pulsed prednisone at a dosage of 40  mg daily tapering down by 10 mg every 4 days until he gets to get a 10  mg daily dose and hold. We will see the patient back then again in  return follow up within a week.     Charlcie Cradle Delford Field, MD, [facility]  Electronically Signed    PEW/MedQ  DD: 04/17/2007  DT: 04/18/2007  Job #: [id number]   cc:   Retta Mac, MD

## 2011-01-18 ENCOUNTER — Other Ambulatory Visit: Payer: Self-pay | Admitting: *Deleted

## 2011-01-18 MED ORDER — AZATHIOPRINE 50 MG PO TABS: ORAL_TABLET | ORAL | Status: DC

## 2011-01-18 NOTE — Telephone Encounter (Signed)
Refill request received from the pt's pharmacy for Azathioprine 50 mg, # 90, take 3 by mouth daily. RX sent to the pharmacy.

## 2011-01-19 ENCOUNTER — Ambulatory Visit (INDEPENDENT_AMBULATORY_CARE_PROVIDER_SITE_OTHER): Payer: Self-pay | Admitting: Internal Medicine

## 2011-02-25 ENCOUNTER — Other Ambulatory Visit: Payer: Self-pay | Admitting: Critical Care Medicine

## 2011-02-25 MED ORDER — AZATHIOPRINE 50 MG PO TABS: ORAL_TABLET | ORAL | Status: DC

## 2011-03-11 ENCOUNTER — Ambulatory Visit (INDEPENDENT_AMBULATORY_CARE_PROVIDER_SITE_OTHER): Payer: Medicare Other | Admitting: Critical Care Medicine

## 2011-03-11 ENCOUNTER — Encounter: Payer: Self-pay | Admitting: Critical Care Medicine

## 2011-03-11 ENCOUNTER — Ambulatory Visit (HOSPITAL_BASED_OUTPATIENT_CLINIC_OR_DEPARTMENT_OTHER)
Admission: RE | Admit: 2011-03-11 | Discharge: 2011-03-11 | Disposition: A | Discharge status: Home / Self Care | Payer: Medicare Other | Source: Ambulatory Visit | Attending: Critical Care Medicine | Admitting: Critical Care Medicine

## 2011-03-11 VITALS — BP 116/62 | HR 75 | Temp 98.4°F | Ht 70.0 in | Wt 233.0 lb

## 2011-03-11 DIAGNOSIS — J8410 Pulmonary fibrosis, unspecified: Secondary | ICD-10-CM | POA: Insufficient documentation

## 2011-03-11 DIAGNOSIS — R599 Enlarged lymph nodes, unspecified: Secondary | ICD-10-CM

## 2011-03-11 MED ORDER — FUROSEMIDE 20 MG PO TABS: 20.0000 mg | ORAL_TABLET | ORAL | Status: DC | PRN

## 2011-03-11 MED ORDER — PREDNISONE 5 MG PO TABS: 5.0000 mg | ORAL_TABLET | Freq: Every day | ORAL | Status: AC

## 2011-03-11 NOTE — Patient Instructions (Addendum)
A pulmonary function study will be obtained along with a 6 minute walk Send [place] lab work from primary care MD office A CT Chest will be obtained I will call with results Refill on lasix sent Reduce prednisone to 5mg  daily  No change in imuran dose Return 4 months [place]

## 2011-03-11 NOTE — Assessment & Plan Note (Addendum)
Pulmonary fibrosis UIP/IPF No biopsy taken, CT Diagnosis No overt etiology discerned  Plan Cont imuran 150mg /d Reduce prednisone to 5mg /d  Repeat CT Chest and full pfts Cont NAC Cont oxygen Cont NAC  Repeat CT Chest /Pulm Function testing Labs

## 2011-03-11 NOTE — Progress Notes (Signed)
Subjective:    Patient ID: Joshua Booth, male    DOB: 05/09/1946, 65 y.o.   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  May 08, 2010: IPF patient. Pre-study screen visit for ASCEND study. He confirms he is off NAC. Several weeks ago had rt knee effusion tappeed by an orthpoid in [employer]. Now well. Back to exercising. Has baseline class 2-3 dyspnea. No change in that. No change in mild baseline cough. Denies edema or new complaints.  July 05, 2010 9:19 AM  The pt has started out pt rehab. The pt did not qualify for [facility]  Dyspnea is sl better. Dietary changes . Uses fish oil and has no GI side effect. No heartburn. No real cough.  September 13, 2010 11:54 AM  No change in dyspnea. Can go longer at work. not as winded.  No real cough. now on oxygen. No burping on the fish oil  Pt denies any significant sore throat, nasal congestion or excess secretions, fever, chills, sweats, unintended weight loss, pleurtic or exertional chest pain, orthopnea PND, or leg swelling  Pt denies any increase in rescue therapy over baseline, denies waking up needing it or having any early am or nocturnal exacerbations of coughing/wheezing/or dyspnea.   03/11/2011 No change in dyspnea,  No cough.  No chest pain.  Now on oxygen  Past Medical History  Diagnosis Date  . Hyperlipemia   . Hypertension   . Pulmonary fibrosis   . Coronary heart disease      History reviewed. No pertinent family history.   History   Social History  . Marital Status: Divorced    Spouse Name: N/A    Number of Children: 1  . Years of Education: N/A   Occupational History  . 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 Smoking status: Former Smoker -- 1.0 packs/day for 20 years    Quit date: 08/06/1983  . Smokeless tobacco: Not on file  . Alcohol Use: Not on file  . Drug Use: Not on file  . Sexually Active: Not on file   Other Topics Concern  . Not on  file   Social History Narrative  . No narrative on file     No Known Allergies   Outpatient Prescriptions Prior to Visit  Medication Sig Dispense Refill  . Acetylcysteine (NAC 600) 600 MG CAPS Take by mouth. 2 in am and 1 at bedtime       . amLODipine (NORVASC) 5 MG tablet Take 5 mg by mouth daily.        Marland Kitchen aspirin 325 MG tablet Take 325 mg by mouth daily.        Marland Kitchen atenolol (TENORMIN) 100 MG tablet Take 100 mg by mouth 2 (two) times daily.        Marland Kitchen atorvastatin (LIPITOR) 40 MG tablet Take 40 mg by mouth at bedtime.        Marland Kitchen azaTHIOprine (IMURAN) 50 MG tablet Take 3 by mouth daily  90 tablet  3  . B Complex-C-Folic Acid (FOLBEE PLUS PO) Take by mouth daily.        Marland Kitchen glucosamine-chondroitin 500-400 MG tablet Take 2 tablets by mouth daily.        Marland Kitchen lisinopril-hydrochlorothiazide (PRINZIDE,ZESTORETIC) 20-25 MG per tablet Take 1 tablet by mouth daily.        . Multiple Vitamin (MULTIVITAMIN) tablet Take 1 tablet by mouth daily.        Marland Kitchen  Omega-3 Fatty Acids (FISH OIL) 300 MG CAPS Take 2 capsules by mouth daily.        . OMEGA-3 KRILL OIL 300 MG CAPS Take 2 capsules by mouth daily.        . furosemide (LASIX) 20 MG tablet Take 20 mg by mouth as needed.        . predniSONE (DELTASONE) 10 MG tablet Take 10 mg by mouth daily.            Review of Systems Constitutional:   No  weight loss, night sweats,  Fevers, chills, fatigue, lassitude. HEENT:   No headaches,  Difficulty swallowing,  Tooth/dental problems,  Sore throat,                No sneezing, itching, ear ache, nasal congestion, post nasal drip,   CV:  No chest pain,  Orthopnea, PND, swelling in lower extremities, anasarca, dizziness, palpitations  GI  No heartburn, indigestion, abdominal pain, nausea, vomiting, diarrhea, change in bowel habits, loss of appetite  Resp: Notes  shortness of breath with exertion not at rest.  No excess mucus, no productive cough,  No non-productive cough,  No coughing up of blood.  No change in color  of mucus.  No wheezing.  No chest wall deformity  Skin: no rash or lesions.  GU: no dysuria, change in color of urine, no urgency or frequency.  No flank pain.  MS:  No joint pain or swelling.  No decreased range of motion.  No back pain.  Psych:  No change in mood or affect. No depression or anxiety.  No memory loss.     Objective:   Physical Exam  Filed Vitals:   03/11/11 0920  BP: 116/62  Pulse: 75  Temp: 98.4 F (36.9 C)  TempSrc: Oral  Height: 5\' 10"  (1.778 m)  Weight: 233 lb (105.688 kg)  SpO2: 92%    Gen: Pleasant, well-nourished, in no distress,  normal affect  ENT: No lesions,  mouth clear,  oropharynx clear, no postnasal drip  Neck: No JVD, no TMG, no carotid bruits  Lungs: No use of accessory muscles, no dullness to percussion, dry rales   Cardiovascular: RRR, heart sounds normal, no murmur or gallops, no peripheral edema  Abdomen: soft and NT, no HSM,  BS normal  Musculoskeletal: No deformities, no cyanosis or clubbing  Neuro: alert, non focal  Skin: Warm, no lesions or rashes       Assessment & Plan:   PULMONARY FIBROSIS Pulmonary fibrosis UIP/IPF No biopsy taken, CT Diagnosis No overt etiology discerned  Plan Cont imuran 150mg /d Reduce prednisone to 5mg /d  Repeat CT Chest and full pfts Cont NAC Cont oxygen Cont NAC  Repeat CT Chest /Pulm Function testing Labs     Updated Medication List Outpatient Encounter Prescriptions as of 03/11/2011  Medication Sig Dispense Refill  . Acetylcysteine (NAC 600) 600 MG CAPS Take by mouth. 2 in am and 1 at bedtime       . amLODipine (NORVASC) 5 MG tablet Take 5 mg by mouth daily.        Marland Kitchen aspirin 325 MG tablet Take 325 mg by mouth daily.        Marland Kitchen atenolol (TENORMIN) 100 MG tablet Take 100 mg by mouth 2 (two) times daily.        Marland Kitchen atorvastatin (LIPITOR) 40 MG tablet Take 40 mg by mouth at bedtime.        Marland Kitchen azaTHIOprine (IMURAN) 50 MG tablet Take 3 by  mouth daily  90 tablet  3  . B Complex-C-Folic  Acid (FOLBEE PLUS PO) Take by mouth daily.        . furosemide (LASIX) 20 MG tablet Take 1 tablet (20 mg total) by mouth as needed.  30 tablet  6  . glucosamine-chondroitin 500-400 MG tablet Take 2 tablets by mouth daily.        Marland Kitchen lisinopril-hydrochlorothiazide (PRINZIDE,ZESTORETIC) 20-25 MG per tablet Take 1 tablet by mouth daily.        . Multiple Vitamin (MULTIVITAMIN) tablet Take 1 tablet by mouth daily.        . Omega-3 Fatty Acids (FISH OIL) 300 MG CAPS Take 2 capsules by mouth daily.        . OMEGA-3 KRILL OIL 300 MG CAPS Take 2 capsules by mouth daily.        Marland Kitchen DISCONTD: furosemide (LASIX) 20 MG tablet Take 20 mg by mouth as needed.        Marland Kitchen DISCONTD: predniSONE (DELTASONE) 10 MG tablet Take 10 mg by mouth daily.        . predniSONE (DELTASONE) 5 MG tablet Take 1 tablet (5 mg total) by mouth daily.  60 tablet  6

## 2011-03-12 ENCOUNTER — Other Ambulatory Visit: Payer: Self-pay | Admitting: Critical Care Medicine

## 2011-03-22 ENCOUNTER — Ambulatory Visit (INDEPENDENT_AMBULATORY_CARE_PROVIDER_SITE_OTHER): Payer: Medicare Other | Admitting: Critical Care Medicine

## 2011-03-22 DIAGNOSIS — R0600 Dyspnea, unspecified: Secondary | ICD-10-CM

## 2011-03-22 DIAGNOSIS — J8410 Pulmonary fibrosis, unspecified: Secondary | ICD-10-CM

## 2011-03-22 DIAGNOSIS — R0609 Other forms of dyspnea: Secondary | ICD-10-CM

## 2011-03-22 LAB — PULMONARY FUNCTION TEST

## 2011-03-22 NOTE — Progress Notes (Signed)
PFT done today. 

## 2011-03-27 ENCOUNTER — Telehealth: Payer: Self-pay | Admitting: Critical Care Medicine

## 2011-03-27 ENCOUNTER — Encounter: Payer: Self-pay | Admitting: Critical Care Medicine

## 2011-03-27 DIAGNOSIS — J8410 Pulmonary fibrosis, unspecified: Secondary | ICD-10-CM

## 2011-03-27 NOTE — Telephone Encounter (Signed)
Pt called and aware of PFT results DLCO and TLC down by 10% vs 8/11

## 2011-05-17 LAB — CULTURE, RESPIRATORY W GRAM STAIN: Gram Stain: NONE SEEN

## 2011-05-17 LAB — BODY FLUID CELL COUNT WITH DIFFERENTIAL

## 2011-05-17 LAB — AFB CULTURE WITH SMEAR (NOT AT ARMC)

## 2011-05-17 LAB — FUNGUS CULTURE W SMEAR: Fungal Smear: NONE SEEN

## 2011-05-17 LAB — LEGIONELLA PROFILE(CULTURE+DFA/SMEAR)

## 2011-05-17 LAB — P CARINII SMEAR DFA

## 2011-08-12 ENCOUNTER — Telehealth: Payer: Self-pay | Admitting: Critical Care Medicine

## 2011-08-12 MED ORDER — AZATHIOPRINE 50 MG PO TABS: ORAL_TABLET | ORAL | Status: DC

## 2011-08-12 NOTE — Telephone Encounter (Signed)
LM at pt's mobile # that refill for Imuran was sent to [facility].

## 2011-08-15 ENCOUNTER — Ambulatory Visit (INDEPENDENT_AMBULATORY_CARE_PROVIDER_SITE_OTHER): Payer: Medicare Other | Admitting: Critical Care Medicine

## 2011-08-15 ENCOUNTER — Encounter: Payer: Self-pay | Admitting: Critical Care Medicine

## 2011-08-15 ENCOUNTER — Other Ambulatory Visit: Payer: Self-pay | Admitting: Critical Care Medicine

## 2011-08-15 VITALS — BP 140/70 | HR 93 | Temp 98.0°F | Ht 69.0 in | Wt 235.0 lb

## 2011-08-15 DIAGNOSIS — E785 Hyperlipidemia, unspecified: Secondary | ICD-10-CM

## 2011-08-15 DIAGNOSIS — J8410 Pulmonary fibrosis, unspecified: Secondary | ICD-10-CM

## 2011-08-15 DIAGNOSIS — I2510 Atherosclerotic heart disease of native coronary artery without angina pectoris: Secondary | ICD-10-CM

## 2011-08-15 DIAGNOSIS — R892 Abnormal level of other drugs, medicaments and biological substances in specimens from other organs, systems and tissues: Secondary | ICD-10-CM

## 2011-08-15 DIAGNOSIS — I10 Essential (primary) hypertension: Secondary | ICD-10-CM

## 2011-08-15 MED ORDER — FISH OIL 300 MG PO CAPS: ORAL_CAPSULE | ORAL | Status: DC

## 2011-08-15 MED ORDER — AZATHIOPRINE 50 MG PO TABS: ORAL_TABLET | ORAL | Status: DC

## 2011-08-15 MED ORDER — PREDNISONE 10 MG PO TABS: ORAL_TABLET | ORAL | Status: DC

## 2011-08-15 NOTE — Progress Notes (Signed)
Subjective:    Patient ID: Joshua Booth, male    DOB: Mar 06, 1946, 67 y.o.   MRN: [id number]  HPI  History of Present Illness:  67 y.o.   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   08/15/2011 Since August 12,  CT scan was done 8/12. Since 8/12: noting more dypsnea, gained weight, not exercising as should.  No chest pain.  Notes some congestion.  No real flu like illness. Has a dry cough, feels congested.  If exerts will wheeze.   Notes sl edema in feet.  Past Medical History  Diagnosis Date  . Hyperlipemia   . Hypertension   . Pulmonary fibrosis   . Coronary heart disease      No family history on file.   History   Social History  . Marital Status: Divorced    Spouse Name: N/A    Number of Children: 1  . Years of Education: N/A   Occupational History  . 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 Smoking status: Former Smoker -- 1.0 packs/day for 20 years    Quit date: 08/06/1983  . Smokeless tobacco: Not on file  . Alcohol Use: Not on file  . Drug Use: Not on file  . Sexually Active: Not on file   Other Topics Concern  . Not on file   Social History Narrative  . No narrative on file     No Known Allergies   Outpatient Prescriptions Prior to Visit  Medication Sig Dispense Refill  . Acetylcysteine (NAC 600) 600 MG CAPS Take by mouth. 2 in am and 1 at bedtime       . amLODipine (NORVASC) 5 MG tablet Take 5 mg by mouth daily.        Marland Kitchen aspirin 325 MG tablet Take 325 mg by mouth daily.        Marland Kitchen atenolol (TENORMIN) 100 MG tablet Take 100 mg by mouth 2 (two) times daily.        Marland Kitchen atorvastatin (LIPITOR) 40 MG tablet Take 40 mg by mouth at bedtime.        . B Complex-C-Folic Acid (FOLBEE PLUS PO) Take by mouth daily.        . furosemide (LASIX) 20 MG tablet Take 1 tablet (20 mg total) by mouth as needed.  30 tablet  6  . glucosamine-chondroitin 500-400 MG tablet Take 2 tablets by mouth daily.        Marland Kitchen lisinopril-hydrochlorothiazide (PRINZIDE,ZESTORETIC)  20-25 MG per tablet Take 1 tablet by mouth daily.        . Multiple Vitamin (MULTIVITAMIN) tablet Take 1 tablet by mouth daily.        Marland Kitchen azaTHIOprine (IMURAN) 50 MG tablet Take 3 by mouth daily  90 tablet  3  . Omega-3 Fatty Acids (FISH OIL) 300 MG CAPS Take 2 capsules by mouth daily.        . OMEGA-3 KRILL OIL 300 MG CAPS Take 2 capsules by mouth daily.            Review of Systems  Constitutional:   No  weight loss, night sweats,  Fevers, chills, fatigue, lassitude. HEENT:   No headaches,  Difficulty swallowing,  Tooth/dental problems,  Sore throat,                No sneezing, itching, ear ache, nasal congestion, post nasal drip,   CV:  No chest pain,  Orthopnea, PND, swelling in lower extremities, anasarca, dizziness,  palpitations  GI  No heartburn, indigestion, abdominal pain, nausea, vomiting, diarrhea, change in bowel habits, loss of appetite  Resp: Notes  shortness of breath with exertion not at rest.  No excess mucus, no productive cough,  No non-productive cough,  No coughing up of blood.  No change in color of mucus.  No wheezing.  No chest wall deformity  Skin: no rash or lesions.  GU: no dysuria, change in color of urine, no urgency or frequency.  No flank pain.  MS:  No joint pain or swelling.  No decreased range of motion.  No back pain.  Psych:  No change in mood or affect. No depression or anxiety.  No memory loss.     Objective:   Physical Exam   Filed Vitals:   08/15/11 1550  BP: 140/70  Pulse: 93  Temp: 98 F (36.7 C)  TempSrc: Oral  Height: 5\' 9"  (1.753 m)  Weight: 235 lb (106.595 kg)  SpO2: 85%    Gen: Pleasant, well-nourished, in no distress,  normal affect  ENT: No lesions,  mouth clear,  oropharynx clear, no postnasal drip  Neck: No JVD, no TMG, no carotid bruits  Lungs: No use of accessory muscles, no dullness to percussion, dry rales   Cardiovascular: RRR, heart sounds normal, no murmur or gallops, no peripheral edema  Abdomen: soft  and NT, no HSM,  BS normal  Musculoskeletal: No deformities, no cyanosis or clubbing  Neuro: alert, non focal  Skin: Warm, no lesions or rashes  8/12: DLCO 38% 8/12 CT chest : mild progression of fibrosis     Assessment & Plan:   PULMONARY FIBROSIS Pulmonary fibrosis UIP/IPF No biopsy taken, CT Diagnosis No overt etiology discerned Slow progression Concern for microaspiration of oil based vitamin desats easily with exertion ,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 Acetylcysteine (NAC 600) 600 MG CAPS Take by mouth. 2 in am and 1 at bedtime       . amLODipine (NORVASC) 5 MG tablet Take 5 mg by mouth daily.        Marland Kitchen aspirin 325 MG tablet Take 325 mg by mouth daily.        Marland Kitchen atenolol (TENORMIN) 100 MG tablet Take 100 mg by mouth 2 (two) times daily.        Marland Kitchen atorvastatin (LIPITOR) 40 MG tablet Take 40 mg by mouth at bedtime.        Marland Kitchen azaTHIOprine (IMURAN) 50 MG tablet Take 4  by mouth daily  120 tablet  6  . B Complex-C-Folic Acid (FOLBEE PLUS PO) Take by mouth daily.        Marland Kitchen BYSTOLIC 10 MG tablet Take 10 mg by mouth Daily.      . furosemide (LASIX) 20 MG tablet Take 1 tablet (20 mg total) by mouth as needed.  30 tablet  6  . glucosamine-chondroitin 500-400 MG tablet Take 2 tablets by mouth daily.        Marland Kitchen  lisinopril-hydrochlorothiazide (PRINZIDE,ZESTORETIC) 20-25 MG per tablet Take 1 tablet by mouth daily.        . Multiple Vitamin (MULTIVITAMIN) tablet Take 1 tablet by mouth daily.          . Omega-3 Fatty Acids (FISH OIL) 300 MG CAPS HOLD      . DISCONTD: azaTHIOprine (IMURAN) 50 MG tablet Take 3 by mouth daily  90 tablet  3  . DISCONTD: Omega-3 Fatty Acids (FISH OIL) 300 MG CAPS Take 2 capsules by mouth daily.        Marland Kitchen DISCONTD: OMEGA-3 KRILL OIL 300 MG CAPS Take 2 capsules by mouth daily.        Marland Kitchen DISCONTD: predniSONE (DELTASONE) 5 MG tablet Take 5 mg by mouth Daily.      . predniSONE (DELTASONE) 10 MG tablet Take 4 for seven  days 3 for seven  days 2 for seven  days  Then 1 daily  100 tablet  6

## 2011-08-15 NOTE — Assessment & Plan Note (Signed)
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

## 2011-08-15 NOTE — Patient Instructions (Signed)
STOP Krill oil HOLD Fish oil for now Increase prednisone using new prednisone Rx 10mg  : 4 daily then reduce by one every 7 days until at 1 a day and stay  (you can use up 5mg  prednisone by taking double the amount) Increase Imuran to 200mg  daily Labs today Increase oxygen to 3Liter rest 6Liter exertion Return 2 months [place]

## 2011-08-16 LAB — CBC
HCT: 37.3 % — ABNORMAL LOW (ref 39.0–52.0)
Hemoglobin: 12.5 g/dL — ABNORMAL LOW (ref 13.0–17.0)
MCHC: 33.5 g/dL (ref 30.0–36.0)
RBC: 4.13 MIL/uL — ABNORMAL LOW (ref 4.22–5.81)

## 2011-08-16 LAB — BASIC METABOLIC PANEL
BUN: 19 mg/dL (ref 6–23)
CO2: 31 mEq/L (ref 19–32)
Glucose, Bld: 108 mg/dL — ABNORMAL HIGH (ref 70–99)
Potassium: 4.3 mEq/L (ref 3.5–5.3)

## 2011-08-20 ENCOUNTER — Telehealth: Payer: Self-pay | Admitting: Critical Care Medicine

## 2011-08-20 ENCOUNTER — Telehealth: Payer: Self-pay | Admitting: Pulmonary Disease

## 2011-08-20 NOTE — Telephone Encounter (Signed)
Spoke with pt. He states that PW wanted him to let him know about what BP meds he is on. He states that he had been taking atenolol, but this was stopped and he was started on bystolic. Med list in Epic already reflects this. Will forward to PW as FYI.

## 2011-08-20 NOTE — Telephone Encounter (Signed)
Wrong doctor °

## 2011-08-20 NOTE — Telephone Encounter (Signed)
noted 

## 2011-10-17 ENCOUNTER — Ambulatory Visit (INDEPENDENT_AMBULATORY_CARE_PROVIDER_SITE_OTHER): Payer: Medicare Other | Admitting: Critical Care Medicine

## 2011-10-17 ENCOUNTER — Encounter: Payer: Self-pay | Admitting: Critical Care Medicine

## 2011-10-17 ENCOUNTER — Other Ambulatory Visit: Payer: Self-pay | Admitting: *Deleted

## 2011-10-17 VITALS — BP 120/62 | Temp 97.8°F | Ht 70.0 in | Wt 236.0 lb

## 2011-10-17 DIAGNOSIS — R945 Abnormal results of liver function studies: Secondary | ICD-10-CM

## 2011-10-17 DIAGNOSIS — J8410 Pulmonary fibrosis, unspecified: Secondary | ICD-10-CM

## 2011-10-17 DIAGNOSIS — K769 Liver disease, unspecified: Secondary | ICD-10-CM

## 2011-10-17 LAB — HEPATIC FUNCTION PANEL
Bilirubin, Direct: 0.3 mg/dL (ref 0.0–0.3)
Total Bilirubin: 0.9 mg/dL (ref 0.3–1.2)

## 2011-10-17 MED ORDER — FUROSEMIDE 20 MG PO TABS: 20.0000 mg | ORAL_TABLET | Freq: Every day | ORAL | Status: DC | PRN

## 2011-10-17 NOTE — Patient Instructions (Signed)
Labs today No other medication changes  I will review the xray report

## 2011-10-17 NOTE — Progress Notes (Signed)
Subjective:    Patient ID: Joshua Booth, male    DOB: Jun 03, 1946, 66 y.o.   MRN: [id number]  HPI  History of Present Illness:  66 y.o.   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   08/15/11 Since August 12,  CT scan was done 8/12. Since 8/12: noting more dypsnea, gained weight, not exercising as should.  No chest pain.  Notes some congestion.  No real flu like illness. Has a dry cough, feels congested.  If exerts will wheeze.   Notes sl edema in feet.   10/17/2011 Pt feels about the same but had a flu like illness 3 weeks ago then  , one week ago with GI issues diarrhea and emesis.  No real cough now.  Feels weak and washed out.  No real chest pain.  No real wheeze.     Past Medical History  Diagnosis Date  . Hyperlipemia   . Hypertension   . Pulmonary fibrosis   . Coronary heart disease      No family history on file.   History   Social History  . Marital Status: Divorced    Spouse Name: N/A    Number of Children: 1  . Years of Education: N/A   Occupational History  . 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 Smoking status: Former Smoker -- 1.0 packs/day for 20 years    Quit date: 08/06/1983  . Smokeless tobacco: Not on file  . Alcohol Use: Not on file  . Drug Use: Not on file  . Sexually Active: Not on file   Other Topics Concern  . Not on file   Social History Narrative  . No narrative on file     No Known Allergies   Outpatient Prescriptions Prior to Visit  Medication Sig Dispense Refill  . Acetylcysteine (NAC 600) 600 MG CAPS Take by mouth. 2 in am and 1 at bedtime       . amLODipine (NORVASC) 5 MG tablet Take 5 mg by mouth daily.        Marland Kitchen aspirin 325 MG tablet Take 325 mg by mouth daily.        Marland Kitchen atorvastatin (LIPITOR) 40 MG tablet Take 40 mg by mouth at bedtime.        Marland Kitchen azaTHIOprine (IMURAN) 50 MG tablet Take 4  by mouth daily  120 tablet  6  . B Complex-C-Folic Acid (FOLBEE PLUS PO) Take by mouth daily.        Marland Kitchen BYSTOLIC 10 MG  tablet Take 10 mg by mouth Daily.      Marland Kitchen glucosamine-chondroitin 500-400 MG tablet Take 2 tablets by mouth daily.        Marland Kitchen lisinopril-hydrochlorothiazide (PRINZIDE,ZESTORETIC) 20-25 MG per tablet Take 1 tablet by mouth daily.        . Multiple Vitamin (MULTIVITAMIN) tablet Take 1 tablet by mouth daily.        . predniSONE (DELTASONE) 10 MG tablet Take 4 for seven  days 3 for seven  days 2 for seven  days  Then 1 daily  100 tablet  6  . atenolol (TENORMIN) 100 MG tablet Take 100 mg by mouth 2 (two) times daily.        . furosemide (LASIX) 20 MG tablet Take 1 tablet (20 mg total) by mouth as needed.  30 tablet  6  . Omega-3 Fatty Acids (FISH OIL) 300 MG CAPS HOLD  Review of Systems  Constitutional:   No  weight loss, night sweats,  Fevers, chills, fatigue, lassitude. HEENT:   No headaches,  Difficulty swallowing,  Tooth/dental problems,  Sore throat,                No sneezing, itching, ear ache, nasal congestion, post nasal drip,   CV:  No chest pain,  Orthopnea, PND, swelling in lower extremities, anasarca, dizziness, palpitations  GI  No heartburn, indigestion, abdominal pain, nausea, vomiting, diarrhea, change in bowel habits, loss of appetite  Resp: Notes  shortness of breath with exertion not at rest.  No excess mucus, no productive cough,  No non-productive cough,  No coughing up of blood.  No change in color of mucus.  No wheezing.  No chest wall deformity  Skin: no rash or lesions.  GU: no dysuria, change in color of urine, no urgency or frequency.  No flank pain.  MS:  No joint pain or swelling.  No decreased range of motion.  No back pain.  Psych:  No change in mood or affect. No depression or anxiety.  No memory loss.     Objective:   Physical Exam   Filed Vitals:   10/17/11 1458  BP: 120/62  Temp: 97.8 F (36.6 C)  TempSrc: Oral  Height: 5\' 10"  (1.778 m)  Weight: 236 lb (107.049 kg)  SpO2: 94%    Gen: Pleasant, well-nourished, in no distress,  normal  affect  ENT: No lesions,  mouth clear,  oropharynx clear, no postnasal drip  Neck: No JVD, no TMG, no carotid bruits  Lungs: No use of accessory muscles, no dullness to percussion, dry rales   Cardiovascular: RRR, heart sounds normal, no murmur or gallops, no peripheral edema  Abdomen: soft and NT, no HSM,  BS normal  Musculoskeletal: No deformities, no cyanosis or clubbing  Neuro: alert, non focal  Skin: Warm, no lesions or rashes  8/12: DLCO 38% 8/12 CT chest : mild progression of fibrosis     Assessment & Plan:   PULMONARY FIBROSIS IPF UIP  Abn LFTs on imuran Plan  d/c imuran  F/u lfts Monitor   Abnormal liver function LFTs abn on imuran Stop imuran and trend LFTs  Lab 10/17/11 1536  AST 60*  ALT 75*  ALKPHOS 92  BILITOT 0.9  PROT 6.2  ALBUMIN 3.7  INR --        Updated Medication List Outpatient Encounter Prescriptions as of 10/17/2011  Medication Sig Dispense Refill  . Acetylcysteine (NAC 600) 600 MG CAPS Take by mouth. 2 in am and 1 at bedtime       . amLODipine (NORVASC) 5 MG tablet Take 5 mg by mouth daily.        Marland Kitchen aspirin 325 MG tablet Take 325 mg by mouth daily.        Marland Kitchen atorvastatin (LIPITOR) 40 MG tablet Take 40 mg by mouth at bedtime.        Marland Kitchen azaTHIOprine (IMURAN) 50 MG tablet Take 4  by mouth daily  120 tablet  6  . B Complex-C-Folic Acid (FOLBEE PLUS PO) Take by mouth daily.        Marland Kitchen BYSTOLIC 10 MG tablet Take 10 mg by mouth Daily.      . furosemide (LASIX) 20 MG tablet Take 1 tablet (20 mg total) by mouth daily as needed.  30 tablet  3  . glucosamine-chondroitin 500-400 MG tablet Take 2 tablets by mouth daily.        Marland Kitchen  lisinopril-hydrochlorothiazide (PRINZIDE,ZESTORETIC) 20-25 MG per tablet Take 1 tablet by mouth daily.        . Multiple Vitamin (MULTIVITAMIN) tablet Take 1 tablet by mouth daily.        Marland Kitchen NITROSTAT 0.4 MG SL tablet as needed.      . predniSONE (DELTASONE) 10 MG tablet Take 10 mg by mouth daily.      Marland Kitchen DISCONTD:  predniSONE (DELTASONE) 10 MG tablet Take 4 for seven  days 3 for seven  days 2 for seven  days  Then 1 daily  100 tablet  6  . atenolol (TENORMIN) 100 MG tablet Take 100 mg by mouth 2 (two) times daily.        Marland Kitchen DISCONTD: cefUROXime (CEFTIN) 500 MG tablet       . DISCONTD: furosemide (LASIX) 20 MG tablet Take 1 tablet (20 mg total) by mouth as needed.  30 tablet  6  . DISCONTD: Omega-3 Fatty Acids (FISH OIL) 300 MG CAPS HOLD

## 2011-10-18 ENCOUNTER — Telehealth: Payer: Self-pay | Admitting: Critical Care Medicine

## 2011-10-18 DIAGNOSIS — J8410 Pulmonary fibrosis, unspecified: Secondary | ICD-10-CM

## 2011-10-18 LAB — CBC WITH DIFFERENTIAL/PLATELET
Eosinophils Relative: 2 % (ref 0–5)
HCT: 38.3 % — ABNORMAL LOW (ref 39.0–52.0)
Hemoglobin: 12.2 g/dL — ABNORMAL LOW (ref 13.0–17.0)
Lymphocytes Relative: 5 % — ABNORMAL LOW (ref 12–46)
Lymphs Abs: 0.4 10*3/uL — ABNORMAL LOW (ref 0.7–4.0)
MCH: 29.6 pg (ref 26.0–34.0)
MCV: 93 fL (ref 78.0–100.0)
Monocytes Relative: 6 % (ref 3–12)
Platelets: 287 10*3/uL (ref 150–400)
RBC: 4.12 MIL/uL — ABNORMAL LOW (ref 4.22–5.81)
WBC: 9.1 10*3/uL (ref 4.0–10.5)

## 2011-10-18 NOTE — Progress Notes (Signed)
Quick Note:  Called pt's home and cell #s - [email or web address] ______ 

## 2011-10-18 NOTE — Progress Notes (Signed)
Quick Note:  Call pt and tell him liver functions mildly abnormal He will need to STOP imuran for now Recheck liver function in 2 weeks ______

## 2011-10-18 NOTE — Assessment & Plan Note (Signed)
LFTs abn on imuran Stop imuran and trend LFTs  Lab 10/17/11 1536  AST 60*  ALT 75*  ALKPHOS 92  BILITOT 0.9  PROT 6.2  ALBUMIN 3.7  INR --

## 2011-10-18 NOTE — Assessment & Plan Note (Signed)
IPF UIP  Abn LFTs on imuran Plan  d/c imuran  F/u lfts Monitor

## 2011-10-18 NOTE — Telephone Encounter (Signed)
Notes Recorded by Storm Frisk, MD on 10/18/2011 at 12:07 PM Call pt and tell him liver functions mildly abnormal He will need to STOP imuran for now Recheck liver function in 2 weeks   I spoke with patient about results and he verbalized understanding and had no questions He is aware to have lab rechecked in 2 weeks and i have placed order in epic

## 2011-11-08 ENCOUNTER — Telehealth: Payer: Self-pay | Admitting: Critical Care Medicine

## 2011-11-08 NOTE — Telephone Encounter (Signed)
Called, spoke with pt.  I informed him LFTs are ok, so he can restart Imuran, same dose as before.  He verbalized understanding and states he was taking imuran 50 mg 4 tablets qd.  Advised I would add this back to his current med list to reflect this change.  He verbalized understanding.  Nothing further needed at this time.

## 2011-11-08 NOTE — Telephone Encounter (Signed)
Called, spoke with pt.  Imuran was stopped d/t liver function being mildly abnormal.  Reports he had liver function labs done on 10/30/11 at Dr. Richardo Hanks office.  He would like Dr. Delford Field look at these results and advise if he should go back on imuran now.  Dr. Delford Field, I see some labs scanned into pt's chart from Dr. Luberta Robertson.  Pls advise.  Thank you.

## 2011-11-08 NOTE — Telephone Encounter (Signed)
Call patient and tell him LFTs are ok so he can restart Imuran , same dose as before

## 2011-11-08 NOTE — Telephone Encounter (Signed)
Called pt's work # was told he is at lunch.  Called his home # - [email or web address]

## 2011-11-08 NOTE — Telephone Encounter (Signed)
i cannot find these labs

## 2011-11-08 NOTE — Telephone Encounter (Signed)
Labs printed and given to PW to address.  Phone msg closed by accident.  WIll forward to PW to address.

## 2011-12-06 ENCOUNTER — Telehealth: Payer: Self-pay | Admitting: Critical Care Medicine

## 2011-12-06 NOTE — Telephone Encounter (Signed)
Called, spoke with pt's son, Joshua Booth.  Informed him of below per PW.  [name] verbalized understanding of this and is in agreement with this plan.

## 2011-12-06 NOTE — Telephone Encounter (Signed)
Noted.  Try to get him to bring [facility] to [facility] instead of [place] [facility] if admitted

## 2011-12-06 NOTE — Telephone Encounter (Signed)
Called, spoke with pt's son, Omarian, who reports pt was admitted to [facility] on Saturday and was d/c'd on yesterday.  Per [name], pt was admitted with questionable pna but was later told they did not think he had pna.  [name] reports pt had approx 10-12 feet to walk to get into house yesterday after d/c and had to stop x 4 d/t being exhausted and labored breathing. Joseff states he can walk approx 5 ft then has to rest.  States pt is on 7-8L.  His o2 sat is 90% on 8L at rest and goes down to the low 80's on 7-8 L with minimal activity.  Per [name], pt's breathing is no worse since he was discharged from [place] [facility] on yesterday. States pt does not feel he is strong enough to come into the office. ?'ing does pt need to be admitted through pulm service so our docs can examine him.  Advised I would speak with Dr. Sherene Sires regarding this and would call him back.  Spoke with Dr. Sherene Sires regarding situation.  Per Dr. Sherene Sires, if pt is comfortable at rest, he will need to come in to be seen.  If he is not comfortable at rest, he will need to call 911 to go to ER.    Called pt's son, Eunice, back.  Per [name], pt is comfortable when he is resting.  Therefore, I advised we should have him come in today to be seen by a provider.  I offered appt this evening but Fishel reports he actually called pt while I was speaking with Dr. Sherene Sires.  Per [name], pt is requesting to hold off for OV throughout the weekend to see how he does.  Stanislaw states they have called pt's PCP to have a walker obtained to help him walk around.  Nicolis states he is staying with the pt now and if pt "goes south" any during this weekend he will call 911.  We have scheduled pt to see TP on Tuesday, May 7 at 4:15 pm for a HFU.  Chadwick will call back if he would like to appt moved sooner and will call 911 in the meantime if pt's breathing worsens or he becomes uncomfortable at rest.    Will close encounter and route to PW so he is aware.

## 2011-12-10 ENCOUNTER — Inpatient Hospital Stay: Payer: Medicare Other | Admitting: Adult Health

## 2011-12-20 ENCOUNTER — Telehealth: Payer: Self-pay | Admitting: Critical Care Medicine

## 2011-12-20 NOTE — Telephone Encounter (Signed)
Pt is aware of recs from [facility]; OV set for June 6th with PW. Aware if any questions regarding Lasix he should contact the cardiologist that Rxed the medication.

## 2011-12-20 NOTE — Telephone Encounter (Signed)
I think it should be safe for him to taper down to his usual 10mg , would do as follows:  - 40mg  x 5 days, 30mg  x 5 days, 20mg  x 5 days then back to 10mg  qd - need to set up f/u with Dr Delford Field to insure stable after taper.   Thanks

## 2011-12-20 NOTE — Telephone Encounter (Signed)
Pt states when he was in the hospital in [place] they increased prednisone to 60mg  daily. Pt states he is feeling 100% better, SOB is improved and states that he is even able to work a few hours a day. Pt was on 10mg  daily before he went to the hospital. He wants to know should he decrease the prednisone or stay on the 60mg  daily. He states his discharge instruct was to continue at this dose, but he states he is much improved since discharge.  Pt was d/c on 12/05/11. He has f/u on 01-09-12. Please advise.  Pt also states that he is having some swelling in both ankles. He states it is not any worse but is no better. He is currently on 20 mg lasix daily. Pt denies any increased in SOB.  Please advise.  Carron Curie, CMA

## 2012-01-07 ENCOUNTER — Inpatient Hospital Stay: Payer: Medicare Other | Admitting: Adult Health

## 2012-01-08 HISTORY — PX: CATARACT EXTRACTION: SUR2

## 2012-01-09 ENCOUNTER — Encounter: Payer: Self-pay | Admitting: Critical Care Medicine

## 2012-01-09 ENCOUNTER — Ambulatory Visit (INDEPENDENT_AMBULATORY_CARE_PROVIDER_SITE_OTHER): Payer: Medicare Other | Admitting: Critical Care Medicine

## 2012-01-09 VITALS — BP 142/76 | HR 86 | Temp 97.5°F | Ht 70.0 in | Wt 210.0 lb

## 2012-01-09 DIAGNOSIS — K769 Liver disease, unspecified: Secondary | ICD-10-CM

## 2012-01-09 DIAGNOSIS — J8410 Pulmonary fibrosis, unspecified: Secondary | ICD-10-CM

## 2012-01-09 DIAGNOSIS — R945 Abnormal results of liver function studies: Secondary | ICD-10-CM

## 2012-01-09 NOTE — Patient Instructions (Addendum)
No change in medications Oxygen dose should now be: 3Liter rest  6 L exertion A referral to [facility] will be made Return 2 months

## 2012-01-09 NOTE — Assessment & Plan Note (Signed)
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

## 2012-01-09 NOTE — Assessment & Plan Note (Signed)
Abnormal liver function tests with recent ultrasound of liver showing fatty liver changes and no cirrhosis Improvement in liver function abnormalities with reduction in Imuran dose from 200 mg to 100 mg daily  Plan Maintain Imuran 100 mg a

## 2012-01-09 NOTE — Progress Notes (Signed)
Subjective:    Patient ID: Joshua Booth, male    DOB: [birth year], 66 y.o.   MRN: [id number]  HPI  History of Present Illness:  66 y.o.   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   01/09/2012 LFTs elevated 3/14.  Restarted 4/5  with improved LFTs.  Since last ov pt in hospital Pt had blood work at [facility] and labs better Adm with worsening pulm fibrosis,  Elevated LFTs, INR elevated, Cr up.  U/S of liver >>fatty liver Now:  Not much cough.  No real chest pain.  No edema in feet with support hose.    Sats 90s..  C3 on portable.  77% on RA.   Labs from dr crowell 5/23:  LFTs normal  INR/PT normal  SCr normal 1.0    Past Medical History  Diagnosis Date  . Hyperlipemia   . Hypertension   . Pulmonary fibrosis   . Coronary heart disease      No family history on file.   History   Social History  . Marital Status: Divorced    Spouse Name: N/A    Number of Children: 1  . Years of Education: N/A   Occupational History  . 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 Smoking status: Former Smoker -- 1.0 packs/day for 20 years    Quit date: 08/06/1983  . Smokeless tobacco: Not on file  . Alcohol Use: Not on file  . Drug Use: Not on file  . Sexually Active: Not on file   Other Topics Concern  . Not on file   Social History Narrative  . No narrative on file     No Known Allergies   Outpatient Prescriptions Prior to Visit  Medication Sig Dispense Refill  . Acetylcysteine (NAC 600) 600 MG CAPS Take by mouth. 2 in am and 1 at bedtime       . aspirin 325 MG tablet Take 325 mg by mouth daily.        Marland Kitchen atenolol (TENORMIN) 100 MG tablet Take 100 mg by mouth 2 (two) times daily.        Marland Kitchen atorvastatin (LIPITOR) 40 MG tablet Take 40 mg by mouth at bedtime.        Marland Kitchen azaTHIOprine (IMURAN) 50 MG tablet Take 2  tablets by mouth once daily      . B Complex-C-Folic Acid (FOLBEE PLUS PO) Take by mouth daily.        Marland Kitchen BYSTOLIC 10 MG tablet Take 10 mg by mouth Daily.      .  furosemide (LASIX) 20 MG tablet Take 1 tablet (20 mg total) by mouth daily as needed.  30 tablet  3  . glucosamine-chondroitin 500-400 MG tablet Take 2 tablets by mouth daily.        Marland Kitchen lisinopril-hydrochlorothiazide (PRINZIDE,ZESTORETIC) 20-25 MG per tablet Take 1 tablet by mouth daily.        . Multiple Vitamin (MULTIVITAMIN) tablet Take 1 tablet by mouth daily.        Marland Kitchen NITROSTAT 0.4 MG SL tablet as needed.      . predniSONE (DELTASONE) 10 MG tablet Take 10 mg by mouth daily.      Marland Kitchen amLODipine (NORVASC) 5 MG tablet Take 5 mg by mouth daily.            Review of Systems  Constitutional:   No  weight loss, night sweats,  Fevers, chills, fatigue, lassitude. HEENT:   No headaches,  Difficulty swallowing,  Tooth/dental problems,  Sore throat,                No sneezing, itching, ear ache, nasal congestion, post nasal drip,   CV:  No chest pain,  Orthopnea, PND, swelling in lower extremities, anasarca, dizziness, palpitations  GI  No heartburn, indigestion, abdominal pain, nausea, vomiting, diarrhea, change in bowel habits, loss of appetite  Resp: Notes  shortness of breath with exertion not at rest.  No excess mucus, no productive cough,  No non-productive cough,  No coughing up of blood.  No change in color of mucus.  No wheezing.  No chest wall deformity  Skin: no rash or lesions.  GU: no dysuria, change in color of urine, no urgency or frequency.  No flank pain.  MS:  No joint pain or swelling.  No decreased range of motion.  No back pain.  Psych:  No change in mood or affect. No depression or anxiety.  No memory loss.     Objective:   Physical Exam   Filed Vitals:   01/09/12 1554 01/09/12 1555  BP: 142/76   Pulse: 86   Temp: 97.5 F (36.4 C)   TempSrc: Oral   Height: 5\' 10"  (1.778 m)   Weight: 210 lb (95.255 kg)   SpO2: 77% 92%    Gen: Pleasant, well-nourished, in no distress,  normal affect  ENT: No lesions,  mouth clear,  oropharynx clear, no postnasal  drip  Neck: No JVD, no TMG, no carotid bruits  Lungs: No use of accessory muscles, no dullness to percussion, dry rales   Cardiovascular: RRR, heart sounds normal, no murmur or gallops, no peripheral edema  Abdomen: soft and NT, no HSM,  BS normal  Musculoskeletal: No deformities, no cyanosis or clubbing  Neuro: alert, non focal  Skin: Warm, no lesions or rashes  8/12: DLCO 38% 8/12 CT chest : mild progression of fibrosis     Assessment & Plan:   PULMONARY FIBROSIS 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 progression Recent hospitalization with elevated liver function tests and abnormal protime INR now resolving with reduction in Imuran dose in Plan Maintain oxygen 3 L rest 6 L exertion Maintain Imuran 100 mg daily Maintain prednisone 10 mg daily Obtain recent CT scan of chest syncope recurrent studies Maintain in acetylcysteine Referral to [facility] for second opinion Return 2 months  Abnormal liver function Abnormal liver function tests with recent ultrasound of liver showing fatty liver changes and no cirrhosis Improvement in liver function abnormalities with reduction in Imuran dose from 200 mg to 100 mg daily  Plan Maintain Imuran 100 mg a     Updated Medication List Outpatient Encounter Prescriptions as of 01/09/2012  Medication Sig Dispense Refill  . Acetylcysteine (NAC 600) 600 MG CAPS Take by mouth. 2 in am and 1 at bedtime       . aspirin 325 MG tablet Take 325 mg by mouth daily.        Marland Kitchen atenolol (TENORMIN) 100 MG tablet Take 100 mg by mouth 2 (two) times daily.        Marland Kitchen atorvastatin (LIPITOR) 40 MG tablet Take 40 mg by mouth at bedtime.        Marland Kitchen azaTHIOprine (IMURAN) 50 MG tablet Take 2  tablets by mouth once daily      . B Complex-C-Folic Acid (FOLBEE PLUS PO) Take by mouth daily.        Marland Kitchen BYSTOLIC 10  MG tablet Take 10 mg by mouth Daily.      . furosemide (LASIX) 20 MG tablet Take 1 tablet (20 mg  total) by mouth daily as needed.  30 tablet  3  . glucosamine-chondroitin 500-400 MG tablet Take 2 tablets by mouth daily.        Marland Kitchen KLOR-CON M20 20 MEQ tablet Take 1 tablet by mouth Daily.      Marland Kitchen lisinopril-hydrochlorothiazide (PRINZIDE,ZESTORETIC) 20-25 MG per tablet Take 1 tablet by mouth daily.        . Multiple Vitamin (MULTIVITAMIN) tablet Take 1 tablet by mouth daily.        Marland Kitchen NITROSTAT 0.4 MG SL tablet as needed.      . predniSONE (DELTASONE) 10 MG tablet Take 10 mg by mouth daily.      Marland Kitchen DISCONTD: amLODipine (NORVASC) 5 MG tablet Take 5 mg by mouth daily.

## 2012-01-28 ENCOUNTER — Other Ambulatory Visit: Payer: Self-pay | Admitting: Critical Care Medicine

## 2012-01-28 DIAGNOSIS — J8410 Pulmonary fibrosis, unspecified: Secondary | ICD-10-CM

## 2012-01-28 MED ORDER — FUROSEMIDE 20 MG PO TABS: 20.0000 mg | ORAL_TABLET | Freq: Every day | ORAL | Status: DC | PRN

## 2012-01-28 NOTE — Telephone Encounter (Signed)
Pharmacy requesting rx for  Lasix 20 mg <> take 1 tablet daily as needed. #30 X3 Pt has appt 03-12-2012  Last fill was 01-04-12 No Known Allergies

## 2012-03-03 ENCOUNTER — Telehealth: Payer: Self-pay | Admitting: Critical Care Medicine

## 2012-03-03 NOTE — Telephone Encounter (Signed)
Spoke with pt. He wanted to let PW know that he is schedule to see Dr. Jon Billings at [facility] 03-10-12 He wants to verify that PW just wants this to be a second opinion appt, and that he will continue to tx the pt here is [place].  Please advise, thanks!

## 2012-03-03 NOTE — Telephone Encounter (Signed)
Absolutely This is a second opinion referral  I will still see him and be his primary pulm MD

## 2012-03-03 NOTE — Telephone Encounter (Signed)
Spoke with pt and notified of recs per PW He verbalized understanding and states nothing further needed 

## 2012-03-12 ENCOUNTER — Ambulatory Visit (INDEPENDENT_AMBULATORY_CARE_PROVIDER_SITE_OTHER): Payer: Medicare Other | Admitting: Critical Care Medicine

## 2012-03-12 ENCOUNTER — Encounter: Payer: Self-pay | Admitting: Critical Care Medicine

## 2012-03-12 VITALS — BP 144/60 | HR 87 | Temp 98.1°F | Ht 70.0 in | Wt 221.0 lb

## 2012-03-12 DIAGNOSIS — J8410 Pulmonary fibrosis, unspecified: Secondary | ICD-10-CM

## 2012-03-12 MED ORDER — AZATHIOPRINE 50 MG PO TABS: 50.0000 mg | ORAL_TABLET | Freq: Every day | ORAL | Status: DC

## 2012-03-12 MED ORDER — PREDNISONE 10 MG PO TABS: ORAL_TABLET | ORAL | Status: DC

## 2012-03-12 NOTE — Progress Notes (Signed)
Subjective:    Patient ID: Joshua Booth, male    DOB: [birth year], 66 y.o.   MRN: [id number]  HPI  History of Present Illness:  66 y.o.   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   03/12/2012 Maintain oxygen 3 L rest 6 L exertion  Maintain Imuran 100 mg daily  Maintain prednisone 10 mg daily  Obtain recent CT scan of chest syncope recurrent studies  Maintain in acetylcysteine  Referral to [facility] fibrosis [facility] for second opinion Dyspnea is the same.  Pt went to see Jon Billings at [place].  No trials available. Tpl out of the question ? Reduce imuran to one/d and wean prednisone.   No real mucus. Pt notes no chest pain.      Past Medical History  Diagnosis Date  . Hyperlipemia   . Hypertension   . Pulmonary fibrosis   . Coronary heart disease      No family history on file.   History   Social History  . Marital Status: Divorced    Spouse Name: N/A    Number of Children: 1  . Years of Education: N/A   Occupational History  . 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 Smoking status: Former Smoker -- 1.0 packs/day for 20 years    Quit date: 08/06/1983  . Smokeless tobacco: Not on file  . Alcohol Use: Not on file  . Drug Use: Not on file  . Sexually Active: Not on file   Other Topics Concern  . Not on file   Social History Narrative  . No narrative on file     No Known Allergies   Outpatient Prescriptions Prior to Visit  Medication Sig Dispense Refill  . Acetylcysteine (NAC 600) 600 MG CAPS Take by mouth. 2 in am and 1 at bedtime       . aspirin 325 MG tablet Take 325 mg by mouth daily.        Marland Kitchen atorvastatin (LIPITOR) 40 MG tablet Take 40 mg by mouth at bedtime.        . B Complex-C-Folic Acid (FOLBEE PLUS PO) Take by mouth daily.        Marland Kitchen BYSTOLIC 10 MG tablet Take 10 mg by mouth Daily.      Marland Kitchen glucosamine-chondroitin 500-400 MG tablet Take 2 tablets by mouth daily.        Marland Kitchen KLOR-CON M20 20 MEQ tablet Take 1 tablet by mouth  Daily.      . Multiple Vitamin (MULTIVITAMIN) tablet Take 1 tablet by mouth daily.        Marland Kitchen NITROSTAT 0.4 MG SL tablet as needed.      Marland Kitchen azaTHIOprine (IMURAN) 50 MG tablet Take 2  tablets by mouth once daily      . furosemide (LASIX) 20 MG tablet Take 1 tablet (20 mg total) by mouth daily as needed.  30 tablet  3  . predniSONE (DELTASONE) 10 MG tablet Take 10 mg by mouth daily.      Marland Kitchen atenolol (TENORMIN) 100 MG tablet Take 100 mg by mouth 2 (two) times daily.        Marland Kitchen lisinopril-hydrochlorothiazide (PRINZIDE,ZESTORETIC) 20-25 MG per tablet Take 1 tablet by mouth daily.            Review of Systems  Constitutional:   No  weight loss, night sweats,  Fevers, chills, fatigue, lassitude. HEENT:   No headaches,  Difficulty swallowing,  Tooth/dental problems,  Sore  throat,                No sneezing, itching, ear ache, nasal congestion, post nasal drip,   CV:  No chest pain,  Orthopnea, PND, swelling in lower extremities, anasarca, dizziness, palpitations  GI  No heartburn, indigestion, abdominal pain, nausea, vomiting, diarrhea, change in bowel habits, loss of appetite  Resp: Notes  shortness of breath with exertion not at rest.  No excess mucus, no productive cough,  No non-productive cough,  No coughing up of blood.  No change in color of mucus.  No wheezing.  No chest wall deformity  Skin: no rash or lesions.  GU: no dysuria, change in color of urine, no urgency or frequency.  No flank pain.  MS:  No joint pain or swelling.  No decreased range of motion.  No back pain.  Psych:  No change in mood or affect. No depression or anxiety.  No memory loss.     Objective:   Physical Exam   Filed Vitals:   03/12/12 1622  BP: 144/60  Pulse: 87  Temp: 98.1 F (36.7 C)  TempSrc: Oral  Height: 5\' 10"  (1.778 m)  Weight: 221 lb (100.245 kg)  SpO2: 94%    Gen: Pleasant, well-nourished, in no distress,  normal affect  ENT: No lesions,  mouth clear,  oropharynx clear, no postnasal  drip  Neck: No JVD, no TMG, no carotid bruits  Lungs: No use of accessory muscles, no dullness to percussion, dry rales   Cardiovascular: RRR, heart sounds normal, no murmur or gallops, no peripheral edema  Abdomen: soft and NT, no HSM,  BS normal  Musculoskeletal: No deformities, no cyanosis or clubbing  Neuro: alert, non focal  Skin: Warm, no lesions or rashes  8/12: DLCO 38% 8/12 CT chest : mild progression of fibrosis     Assessment & Plan:   PULMONARY FIBROSIS 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 Acetylcysteine (NAC 600) 600 MG CAPS Take by mouth. 2 in am and 1 at bedtime       . aspirin 325 MG tablet Take 325 mg by mouth daily.        Marland Kitchen atorvastatin (LIPITOR) 40 MG tablet Take 40 mg by mouth at bedtime.        Marland Kitchen azaTHIOprine (IMURAN) 50 MG tablet Take 1 tablet (50 mg total) by mouth daily. Take 2  tablets by mouth once daily      . B Complex-C-Folic Acid (FOLBEE PLUS PO) Take by mouth daily.        Marland Kitchen BYSTOLIC 10 MG tablet Take 10 mg by mouth Daily.      . furosemide (LASIX) 20 MG tablet Take 20 mg by mouth 2 (two) times daily.      Marland Kitchen glucosamine-chondroitin 500-400 MG tablet Take 2 tablets by mouth daily.        Marland Kitchen KLOR-CON M20 20 MEQ tablet Take 1 tablet by mouth Daily.      Marland Kitchen lisinopril (PRINIVIL,ZESTRIL) 20 MG tablet Take 20 mg by mouth daily.      . Multiple Vitamin (MULTIVITAMIN) tablet Take 1 tablet by mouth  daily.        Marland Kitchen NITROSTAT 0.4 MG SL tablet as needed.      . predniSONE (DELTASONE) 10 MG tablet 1/2 tablet daily      . DISCONTD: azaTHIOprine (IMURAN) 50 MG tablet  Take 2  tablets by mouth once daily      . DISCONTD: furosemide (LASIX) 20 MG tablet Take 1 tablet (20 mg total) by mouth daily as needed.  30 tablet  3  . DISCONTD: predniSONE (DELTASONE) 10 MG tablet Take 10 mg by mouth daily.      Marland Kitchen atenolol (TENORMIN) 100 MG tablet Take 100 mg by mouth 2 (two) times daily.        Marland Kitchen DISCONTD: lisinopril-hydrochlorothiazide (PRINZIDE,ZESTORETIC) 20-25 MG per tablet Take 1 tablet by mouth daily.

## 2012-03-12 NOTE — Patient Instructions (Addendum)
Reduce prednisone to 5mg  (1/2 tab) daily Reduce imuran to 50mg  ( 1 tab) daily Return 2 months I will check into availability of pirfenidone from [place]

## 2012-03-13 NOTE — Assessment & Plan Note (Signed)
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

## 2012-03-25 ENCOUNTER — Other Ambulatory Visit: Payer: Self-pay | Admitting: Critical Care Medicine

## 2012-03-25 MED ORDER — FUROSEMIDE 20 MG PO TABS: 20.0000 mg | ORAL_TABLET | Freq: Two times a day (BID) | ORAL | Status: DC

## 2012-03-25 NOTE — Telephone Encounter (Signed)
Faxed refill request received for Furosemide 20mg  tablets, take 1 tablet by mouth every day. Patient last seen 03/12/12. Refill sent in.

## 2012-05-07 ENCOUNTER — Telehealth: Payer: Self-pay | Admitting: Critical Care Medicine

## 2012-05-07 NOTE — Telephone Encounter (Signed)
lmomtcb  

## 2012-05-08 NOTE — Telephone Encounter (Signed)
[email or web address] x 1 

## 2012-05-08 NOTE — Telephone Encounter (Signed)
Our records show the pt had his last pneumovax on 05/28/2007. Dr. Delford Field please advise if pt needs another pneumovax.

## 2012-05-08 NOTE — Telephone Encounter (Signed)
Yes, he needs one more pneumovax

## 2012-05-11 NOTE — Telephone Encounter (Signed)
lmomtcb x2 on home #. Called mobile # and VM has not been set up yet. wcb

## 2012-05-12 MED ORDER — PNEUMOCOCCAL VAC POLYVALENT 25 MCG/0.5ML IJ INJ: 0.5000 mL | INJECTION | Freq: Once | INTRAMUSCULAR | Status: DC

## 2012-05-12 NOTE — Telephone Encounter (Signed)
Pt advised. Pt states he has an appt to get flu shot at local [facility] and wants rx sent there for Pneumovax so he can get it all at same time.Rx sent. Carron Curie, CMA

## 2012-06-02 ENCOUNTER — Telehealth: Payer: Self-pay | Admitting: Critical Care Medicine

## 2012-06-02 MED ORDER — FUROSEMIDE 20 MG PO TABS: 20.0000 mg | ORAL_TABLET | Freq: Two times a day (BID) | ORAL | Status: DC

## 2012-06-02 NOTE — Telephone Encounter (Signed)
Rx has been sent to pt pharmacy, he is aware.

## 2012-06-18 ENCOUNTER — Inpatient Hospital Stay: Payer: Medicare Other | Admitting: Critical Care Medicine

## 2012-06-26 ENCOUNTER — Other Ambulatory Visit: Payer: Self-pay | Admitting: Critical Care Medicine

## 2012-06-26 DIAGNOSIS — J8410 Pulmonary fibrosis, unspecified: Secondary | ICD-10-CM

## 2012-07-06 ENCOUNTER — Ambulatory Visit (INDEPENDENT_AMBULATORY_CARE_PROVIDER_SITE_OTHER): Payer: Medicare Other | Admitting: Critical Care Medicine

## 2012-07-06 ENCOUNTER — Encounter: Payer: Self-pay | Admitting: Critical Care Medicine

## 2012-07-06 VITALS — BP 116/64 | HR 77 | Temp 98.2°F | Ht 70.0 in | Wt 213.0 lb

## 2012-07-06 DIAGNOSIS — J8410 Pulmonary fibrosis, unspecified: Secondary | ICD-10-CM

## 2012-07-06 DIAGNOSIS — R0902 Hypoxemia: Secondary | ICD-10-CM

## 2012-07-06 DIAGNOSIS — J9611 Chronic respiratory failure with hypoxia: Secondary | ICD-10-CM | POA: Insufficient documentation

## 2012-07-06 DIAGNOSIS — J9610 Chronic respiratory failure, unspecified whether with hypoxia or hypercapnia: Secondary | ICD-10-CM

## 2012-07-06 MED ORDER — PREDNISONE 10 MG PO TABS: 20.0000 mg | ORAL_TABLET | Freq: Every day | ORAL | Status: DC

## 2012-07-06 NOTE — Progress Notes (Signed)
Subjective:    Patient ID: Joshua Booth, male    DOB: Mar 10, 1946, 66 y.o.   MRN: [id number]  HPI  History of Present Illness:  66 y.o.   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   03/12/2012 Maintain oxygen 3 L rest 6 L exertion  Maintain Imuran 100 mg daily  Maintain prednisone 10 mg daily  Obtain recent CT scan of chest syncope recurrent studies  Maintain in acetylcysteine  Referral to [facility] right fibrosis clinic for second opinion Dyspnea is the same.  Pt went to see Jon Billings at [place].  No trials available. Tpl out of the question ? Reduce imuran to one/d and wean prednisone.   No real mucus. Pt notes no chest pain.   07/06/2012 Since last OV the pt was admitted [facility] 06/2012.  Pt admitted 3-4 days. No Dx was rendered. Pt had recently reduced imuran and prednisone. Pt started getting worse first of November.  Pt noted more dyspnea, cold sweats.  No increase in cough.  No mucus. No chest pain. Noted some edema but is chronic in LE.  Prednisone was increased. Since d/c pt feels better.  Dyspnea is better .  Pt is on 3Liters rest and 5-6 exertion. Pt sleeps on 4.5L    Past Medical History  Diagnosis Date  . Hyperlipemia   . Hypertension   . Pulmonary fibrosis   . Coronary heart disease      History reviewed. No pertinent family history.   History   Social History  . Marital Status: Divorced    Spouse Name: N/A    Number of Children: 1  . Years of Education: N/A   Occupational History  . 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 Smoking status: Former Smoker -- 1.0 packs/day for 20 years    Quit date: 08/06/1983  . Smokeless tobacco: Not on file  . Alcohol Use: Not on file  . Drug Use: Not on file  . Sexually Active: Not on file   Other Topics Concern  . Not on file   Social History Narrative  . No narrative on file     No Known Allergies   Outpatient Prescriptions Prior to Visit  Medication Sig Dispense Refill  . Acetylcysteine  (NAC 600) 600 MG CAPS Take by mouth. 2 in am and 1 at bedtime       . aspirin 325 MG tablet Take 325 mg by mouth daily.        Marland Kitchen atorvastatin (LIPITOR) 40 MG tablet Take 40 mg by mouth at bedtime.        . B Complex-C-Folic Acid (FOLBEE PLUS PO) Take by mouth daily.        Marland Kitchen BYSTOLIC 10 MG tablet Take 10 mg by mouth Daily.      . furosemide (LASIX) 20 MG tablet Take 1 tablet (20 mg total) by mouth 2 (two) times daily.  30 tablet  3  . glucosamine-chondroitin 500-400 MG tablet Take 2 tablets by mouth daily.        Marland Kitchen KLOR-CON M20 20 MEQ tablet Take 1 tablet by mouth Daily.      . Multiple Vitamin (MULTIVITAMIN) tablet Take 1 tablet by mouth daily.        Marland Kitchen NITROSTAT 0.4 MG SL tablet as needed.      . [DISCONTINUED] azaTHIOprine (IMURAN) 50 MG tablet Take 1 tablet (50 mg total) by mouth daily. Take 2  tablets by mouth once daily      . [  DISCONTINUED] predniSONE (DELTASONE) 10 MG tablet 1/2 tablet daily      . [DISCONTINUED] atenolol (TENORMIN) 100 MG tablet Take 100 mg by mouth 2 (two) times daily.        . [DISCONTINUED] lisinopril (PRINIVIL,ZESTRIL) 20 MG tablet Take 20 mg by mouth daily.      . [DISCONTINUED] pneumococcal 23 valent vaccine (PNEUMOVAX 23) 25 MCG/0.5ML injection Inject 0.5 mLs into the muscle once.  2.5 mL  0  Last reviewed on 07/06/2012 11:13 AM by Storm Frisk, MD    Review of Systems  Constitutional:   No  weight loss, night sweats,  Fevers, chills, fatigue, lassitude. HEENT:   No headaches,  Difficulty swallowing,  Tooth/dental problems,  Sore throat,                No sneezing, itching, ear ache, nasal congestion, post nasal drip,   CV:  No chest pain,  Orthopnea, PND, swelling in lower extremities, anasarca, dizziness, palpitations  GI  No heartburn, indigestion, abdominal pain, nausea, vomiting, diarrhea, change in bowel habits, loss of appetite  Resp: Notes  shortness of breath with exertion not at rest.  No excess mucus, no productive cough,  No non-productive  cough,  No coughing up of blood.  No change in color of mucus.  No wheezing.  No chest wall deformity  Skin: no rash or lesions.  GU: no dysuria, change in color of urine, no urgency or frequency.  No flank pain.  MS:  No joint pain or swelling.  No decreased range of motion.  No back pain.  Psych:  No change in mood or affect. No depression or anxiety.  No memory loss.     Objective:   Physical Exam   Filed Vitals:   07/06/12 1101  BP: 116/64  Pulse: 77  Temp: 98.2 F (36.8 C)  TempSrc: Oral  Height: 5\' 10"  (1.778 m)  Weight: 96.616 kg (213 lb)  SpO2: 96%    Gen: Pleasant, well-nourished, in no distress,  normal affect  ENT: No lesions,  mouth clear,  oropharynx clear, no postnasal drip  Neck: No JVD, no TMG, no carotid bruits  Lungs: No use of accessory muscles, no dullness to percussion, dry rales   Cardiovascular: RRR, heart sounds normal, no murmur or gallops, no peripheral edema  Abdomen: soft and NT, no HSM,  BS normal  Musculoskeletal: No deformities, no cyanosis or clubbing  Neuro: alert, non focal  Skin: Warm, no lesions or rashes     Assessment & Plan:   PULMONARY FIBROSIS Idiopathic pulmonary fibrosis with recent flare likely underlying infection that cannot be ruled out now with progressive hypoxemia Plan Increase prednisone to 20mg  daily and stay (refill sent to pharmacy) Stay on one imuran daily Use oxygen 3Liter rest 6Liter exertion 4.5 Liter sleep A referral to pulmonary rehab was made to [facility]     Updated Medication List Outpatient Encounter Prescriptions as of 07/06/2012  Medication Sig Dispense Refill  . Acetylcysteine (NAC 600) 600 MG CAPS Take by mouth. 2 in am and 1 at bedtime       . aspirin 325 MG tablet Take 325 mg by mouth daily.        Marland Kitchen atorvastatin (LIPITOR) 40 MG tablet Take 40 mg by mouth at bedtime.        Marland Kitchen azaTHIOprine (IMURAN) 50 MG tablet Take 50 mg by mouth daily.      . B Complex-C-Folic Acid (FOLBEE  PLUS PO) Take by mouth  daily.        Marland Kitchen BYSTOLIC 10 MG tablet Take 10 mg by mouth Daily.      . furosemide (LASIX) 20 MG tablet Take 1 tablet (20 mg total) by mouth 2 (two) times daily.  30 tablet  3  . glucosamine-chondroitin 500-400 MG tablet Take 2 tablets by mouth daily.        Marland Kitchen KLOR-CON M20 20 MEQ tablet Take 1 tablet by mouth Daily.      Marland Kitchen lisinopril-hydrochlorothiazide (PRINZIDE,ZESTORETIC) 20-25 MG per tablet Take 1 tablet by mouth daily.      . Multiple Vitamin (MULTIVITAMIN) tablet Take 1 tablet by mouth daily.        Marland Kitchen NITROSTAT 0.4 MG SL tablet as needed.      . predniSONE (DELTASONE) 10 MG tablet Take 2 tablets (20 mg total) by mouth daily.  60 tablet  6  . PROAIR HFA 108 (90 BASE) MCG/ACT inhaler Inhale 2 puffs into the lungs Every 6 hours as needed.      . [DISCONTINUED] azaTHIOprine (IMURAN) 50 MG tablet Take 1 tablet (50 mg total) by mouth daily. Take 2  tablets by mouth once daily      . [DISCONTINUED] predniSONE (DELTASONE) 10 MG tablet 1/2 tablet daily      . [DISCONTINUED] predniSONE (DELTASONE) 10 MG tablet Take 10 mg by mouth daily.      . [DISCONTINUED] atenolol (TENORMIN) 100 MG tablet Take 100 mg by mouth 2 (two) times daily.        . [DISCONTINUED] lisinopril (PRINIVIL,ZESTRIL) 20 MG tablet Take 20 mg by mouth daily.      . [DISCONTINUED] pneumococcal 23 valent vaccine (PNEUMOVAX 23) 25 MCG/0.5ML injection Inject 0.5 mLs into the muscle once.  2.5 mL  0

## 2012-07-06 NOTE — Patient Instructions (Addendum)
Increase prednisone to 20mg  daily and stay (refill sent to pharmacy) Stay on one imuran daily Use oxygen 3Liter rest 6Liter exertion 4.5 Liter sleep A referral to pulmonary rehab was made to [facility] We will have you sign a record release for images and records from November Hospital stay, I will call you once it is reviewed Return 2 months

## 2012-07-07 NOTE — Assessment & Plan Note (Signed)
Idiopathic pulmonary fibrosis with recent flare likely underlying infection that cannot be ruled out now with progressive hypoxemia Plan Increase prednisone to 20mg  daily and stay (refill sent to pharmacy) Stay on one imuran daily Use oxygen 3Liter rest 6Liter exertion 4.5 Liter sleep A referral to pulmonary rehab was made to [facility]

## 2012-09-03 ENCOUNTER — Ambulatory Visit: Payer: Medicare Other | Admitting: Critical Care Medicine

## 2012-09-23 ENCOUNTER — Other Ambulatory Visit: Payer: Self-pay | Admitting: *Deleted

## 2012-09-23 MED ORDER — AZATHIOPRINE 50 MG PO TABS: 50.0000 mg | ORAL_TABLET | Freq: Every day | ORAL | Status: DC

## 2012-10-20 ENCOUNTER — Telehealth: Payer: Self-pay | Admitting: Critical Care Medicine

## 2012-10-20 NOTE — Telephone Encounter (Signed)
Called, spoke with pt to verify msg.  Pt states he was advised at first that [facility] was going to stop carrying liquid o2.  However, he has now been advised by [facility] that they will cont to carry liquid o2.  Pt states because of this, nothing further is needed at this time.  He will call back if anything changes.  Will sign off.

## 2012-10-20 NOTE — Telephone Encounter (Signed)
Pt called back stating to disregard message.  He doesn't need to switch companies right now.  Nothing further needed @ this time. Leanora Ivanoff

## 2012-10-22 ENCOUNTER — Other Ambulatory Visit: Payer: Self-pay | Admitting: *Deleted

## 2012-10-22 MED ORDER — FUROSEMIDE 20 MG PO TABS: 20.0000 mg | ORAL_TABLET | Freq: Two times a day (BID) | ORAL | Status: DC

## 2012-10-29 ENCOUNTER — Ambulatory Visit: Payer: Medicare Other | Admitting: Critical Care Medicine

## 2012-11-19 ENCOUNTER — Ambulatory Visit: Payer: Medicare Other | Admitting: Critical Care Medicine

## 2012-11-23 ENCOUNTER — Other Ambulatory Visit: Payer: Self-pay | Admitting: Critical Care Medicine

## 2012-11-24 NOTE — Telephone Encounter (Signed)
Furosemide 20 mg bid rx sent on 10/22/12 but it was sent for #30 x 6.  This will only last for 15 days. Thayer Ohm with Pharm aware new rx will be sent.

## 2012-12-01 ENCOUNTER — Other Ambulatory Visit: Payer: Self-pay | Admitting: Critical Care Medicine

## 2012-12-01 MED ORDER — FUROSEMIDE 20 MG PO TABS: 20.0000 mg | ORAL_TABLET | Freq: Two times a day (BID) | ORAL | Status: DC

## 2012-12-01 NOTE — Telephone Encounter (Signed)
Per Dr. Delford Field on incoming fax-- ok for 20mg  Lasix BID #60 with 6 refills

## 2012-12-02 ENCOUNTER — Telehealth: Payer: Self-pay | Admitting: Critical Care Medicine

## 2012-12-02 NOTE — Telephone Encounter (Signed)
I called and spoke with [facility]. Was advised they do have RX on file for pt #60 tablets for lasix but pt can not pick it up until Friday. I called and made pt aware of this. He voiced his understanding and needed nothing further

## 2012-12-10 ENCOUNTER — Encounter: Payer: Self-pay | Admitting: Critical Care Medicine

## 2012-12-10 ENCOUNTER — Ambulatory Visit (INDEPENDENT_AMBULATORY_CARE_PROVIDER_SITE_OTHER): Payer: Medicare Other | Admitting: Critical Care Medicine

## 2012-12-10 VITALS — BP 100/62 | HR 70 | Temp 98.1°F | Ht 70.0 in | Wt 212.0 lb

## 2012-12-10 DIAGNOSIS — J9611 Chronic respiratory failure with hypoxia: Secondary | ICD-10-CM

## 2012-12-10 DIAGNOSIS — R0902 Hypoxemia: Secondary | ICD-10-CM

## 2012-12-10 DIAGNOSIS — J9610 Chronic respiratory failure, unspecified whether with hypoxia or hypercapnia: Secondary | ICD-10-CM

## 2012-12-10 DIAGNOSIS — J8410 Pulmonary fibrosis, unspecified: Secondary | ICD-10-CM

## 2012-12-10 NOTE — Patient Instructions (Addendum)
We will see if we can get the Pirfenidone for your from [facility] .  It will be approved and marketed probably by first QTR 2015,  We may be able to get it sooner via an observation study. I will have your chart screened for the study.  No other medication changes Labs today for liver function testing and CBC for imuran dosing Return 4 months

## 2012-12-10 NOTE — Progress Notes (Signed)
Subjective:    Patient ID: Joshua Booth, male    DOB: Apr 01, 1946, 67 y.o.   MRN: [id number]  HPI  History of Present Illness:  67 y.o.   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     12/10/2012 Notes some edema in feet.  Pt did get better on own. Pt notes some dizziness at work 5 days ago. PCP thought virus. Oximeter at home: >90% 3L rest 5-6 exertion.  No mucus. No chest pain.     Past Medical History  Diagnosis Date  . Hyperlipemia   . Hypertension   . Pulmonary fibrosis   . Coronary heart disease      No family history on file.   History   Social History  . Marital Status: Divorced    Spouse Name: N/A    Number of Children: 1  . Years of Education: N/A   Occupational History  . 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 Smoking status: Former Smoker -- 1.00 packs/day for 20 years    Quit date: 08/06/1983  . Smokeless tobacco: Not on file  . Alcohol Use: Not on file  . Drug Use: Not on file  . Sexually Active: Not on file   Other Topics Concern  . Not on file   Social History Narrative  . No narrative on file     No Known Allergies   Outpatient Prescriptions Prior to Visit  Medication Sig Dispense Refill  . Acetylcysteine (NAC 600) 600 MG CAPS Take by mouth. 2 in am and 1 at bedtime       . aspirin 325 MG tablet Take 325 mg by mouth daily.        Marland Kitchen atorvastatin (LIPITOR) 40 MG tablet Take 40 mg by mouth at bedtime.        Marland Kitchen azaTHIOprine (IMURAN) 50 MG tablet Take 1 tablet (50 mg total) by mouth daily.  30 tablet  3  . B Complex-C-Folic Acid (FOLBEE PLUS PO) Take by mouth daily.        Marland Kitchen BYSTOLIC 10 MG tablet Take 10 mg by mouth Daily.      . furosemide (LASIX) 20 MG tablet Take 1 tablet (20 mg total) by mouth 2 (two) times daily.  60 tablet  6  . glucosamine-chondroitin 500-400 MG tablet Take 2 tablets by mouth daily.        Marland Kitchen KLOR-CON M20 20 MEQ tablet Take 1 tablet by mouth Daily.      Marland Kitchen lisinopril-hydrochlorothiazide (PRINZIDE,ZESTORETIC)  20-25 MG per tablet Take 1 tablet by mouth daily.      . Multiple Vitamin (MULTIVITAMIN) tablet Take 1 tablet by mouth daily.        Marland Kitchen NITROSTAT 0.4 MG SL tablet as needed.      . predniSONE (DELTASONE) 10 MG tablet Take 2 tablets (20 mg total) by mouth daily.  60 tablet  6  . PROAIR HFA 108 (90 BASE) MCG/ACT inhaler Inhale 2 puffs into the lungs Every 6 hours as needed.       No facility-administered medications prior to visit.      Review of Systems  Constitutional:   No  weight loss, night sweats,  Fevers, chills, fatigue, lassitude. HEENT:   No headaches,  Difficulty swallowing,  Tooth/dental problems,  Sore throat,                No sneezing, itching, ear ache, nasal congestion, post nasal drip,   CV:  No  chest pain,  Orthopnea, PND, swelling in lower extremities, anasarca, dizziness, palpitations  GI  No heartburn, indigestion, abdominal pain, nausea, vomiting, diarrhea, change in bowel habits, loss of appetite  Resp: Notes  shortness of breath with exertion not at rest.  No excess mucus, no productive cough,  No non-productive cough,  No coughing up of blood.  No change in color of mucus.  No wheezing.  No chest wall deformity  Skin: no rash or lesions.  GU: no dysuria, change in color of urine, no urgency or frequency.  No flank pain.  MS:  No joint pain or swelling.  No decreased range of motion.  No back pain.  Psych:  No change in mood or affect. No depression or anxiety.  No memory loss.     Objective:   Physical Exam   Filed Vitals:   12/10/12 1550  BP: 100/62  Pulse: 70  Temp: 98.1 F (36.7 C)  TempSrc: Oral  Height: 5\' 10"  (1.778 m)  Weight: 212 lb (96.163 kg)  SpO2: 89%    Gen: Pleasant, well-nourished, in no distress,  normal affect  ENT: No lesions,  mouth clear,  oropharynx clear, no postnasal drip  Neck: No JVD, no TMG, no carotid bruits  Lungs: No use of accessory muscles, no dullness to percussion, dry rales   Cardiovascular: RRR, heart  sounds normal, no murmur or gallops, no peripheral edema  Abdomen: soft and NT, no HSM,  BS normal  Musculoskeletal: No deformities, no cyanosis or clubbing  Neuro: alert, non focal  Skin: Warm, no lesions or rashes     Assessment & Plan:   PULMONARY FIBROSIS Progressive Pulmonary fibrosis ILD/UIP Plan See if can get pt enrolled in pirfenidone open label trial No other med changes Note recent labs at pcp ok LFTs. CBC ok with imuran at 50mg  /d and to continue prednisone Cont oxygen therapy  Chronic respiratory failure with hypoxia On oxygen therapy d/t ILD Cont same    Updated Medication List Outpatient Encounter Prescriptions as of 12/10/2012  Medication Sig Dispense Refill  . Acetylcysteine (NAC 600) 600 MG CAPS Take by mouth. 2 in am and 1 at bedtime       . aspirin 325 MG tablet Take 325 mg by mouth daily.        Marland Kitchen atorvastatin (LIPITOR) 40 MG tablet Take 40 mg by mouth at bedtime.        Marland Kitchen azaTHIOprine (IMURAN) 50 MG tablet Take 1 tablet (50 mg total) by mouth daily.  30 tablet  3  . B Complex-C-Folic Acid (FOLBEE PLUS PO) Take by mouth daily.        Marland Kitchen BYSTOLIC 10 MG tablet Take 10 mg by mouth Daily.      . furosemide (LASIX) 20 MG tablet Take 1 tablet (20 mg total) by mouth 2 (two) times daily.  60 tablet  6  . glucosamine-chondroitin 500-400 MG tablet Take 2 tablets by mouth daily.        Marland Kitchen KLOR-CON M20 20 MEQ tablet Take 1 tablet by mouth Daily.      Marland Kitchen lisinopril-hydrochlorothiazide (PRINZIDE,ZESTORETIC) 20-25 MG per tablet Take 1 tablet by mouth daily.      . Multiple Vitamin (MULTIVITAMIN) tablet Take 1 tablet by mouth daily.        Marland Kitchen NITROSTAT 0.4 MG SL tablet as needed.      . predniSONE (DELTASONE) 10 MG tablet Take 2 tablets (20 mg total) by mouth daily.  60 tablet  6  .  PROAIR HFA 108 (90 BASE) MCG/ACT inhaler Inhale 2 puffs into the lungs Every 6 hours as needed.       No facility-administered encounter medications on file as of 12/10/2012.

## 2012-12-13 NOTE — Assessment & Plan Note (Signed)
On oxygen therapy d/t ILD Cont same

## 2012-12-13 NOTE — Assessment & Plan Note (Signed)
Progressive Pulmonary fibrosis ILD/UIP Plan See if can get pt enrolled in pirfenidone open label trial No other med changes Note recent labs at pcp ok LFTs. CBC ok with imuran at 50mg  /d and to continue prednisone Cont oxygen therapy

## 2013-02-15 ENCOUNTER — Other Ambulatory Visit: Payer: Self-pay | Admitting: Critical Care Medicine

## 2013-03-10 ENCOUNTER — Telehealth: Payer: Self-pay | Admitting: Critical Care Medicine

## 2013-03-10 NOTE — Telephone Encounter (Signed)
Spoke with patient, patient inquiring about research drug for pirfenidone Per last OV night w Dr. Delford Field: Patient Instructions    We will see if we can get the Pirfenidone for your from [facility] . It will be approved and marketed probably by first QTR 2015, We may be able to get it sooner via an observation study. I will have your chart screened for the study.  No other medication changes  Labs today for liver function testing and CBC for imuran dosing  Return 4 months   I will send staff msg to [facility] Programmer, multimedia) as stated above and have her advise Thanks Para March  --will also forward to Dr. Delford Field so that he is aware

## 2013-03-10 NOTE — Telephone Encounter (Signed)
ATC patient no answer [id number] 

## 2013-03-12 NOTE — Telephone Encounter (Signed)
Per Garnet Sierras Kessie Croston, This patient was screened for the previous [employer] trial (placebo/pirfenidone) and failed. I re screened him when I found out about the extended access program but he failed again based on his [facility] of 33 (Aug 17/12).  ------------------ ATC patient, no answer [id number]

## 2013-03-12 NOTE — Telephone Encounter (Signed)
Pt aware of the below and denied any questions Nothing further needed

## 2013-03-15 ENCOUNTER — Other Ambulatory Visit: Payer: Self-pay | Admitting: Critical Care Medicine

## 2013-04-27 ENCOUNTER — Ambulatory Visit (INDEPENDENT_AMBULATORY_CARE_PROVIDER_SITE_OTHER): Payer: Medicare Other | Admitting: Adult Health

## 2013-04-27 ENCOUNTER — Emergency Department (HOSPITAL_BASED_OUTPATIENT_CLINIC_OR_DEPARTMENT_OTHER): Payer: Medicare Other

## 2013-04-27 ENCOUNTER — Inpatient Hospital Stay (HOSPITAL_BASED_OUTPATIENT_CLINIC_OR_DEPARTMENT_OTHER)
Admission: EM | Admit: 2013-04-27 | Discharge: 2013-05-02 | DRG: 175 | Disposition: A | Discharge status: Home Health | Payer: Medicare Other | Attending: Internal Medicine | Admitting: Internal Medicine

## 2013-04-27 ENCOUNTER — Encounter: Payer: Self-pay | Admitting: Adult Health

## 2013-04-27 ENCOUNTER — Observation Stay (HOSPITAL_COMMUNITY): Payer: Medicare Other

## 2013-04-27 ENCOUNTER — Other Ambulatory Visit: Payer: Self-pay

## 2013-04-27 ENCOUNTER — Encounter (HOSPITAL_BASED_OUTPATIENT_CLINIC_OR_DEPARTMENT_OTHER): Payer: Self-pay

## 2013-04-27 VITALS — BP 96/52 | HR 91 | Temp 97.6°F | Ht 70.0 in | Wt 205.0 lb

## 2013-04-27 DIAGNOSIS — I5032 Chronic diastolic (congestive) heart failure: Secondary | ICD-10-CM | POA: Diagnosis present

## 2013-04-27 DIAGNOSIS — I82409 Acute embolism and thrombosis of unspecified deep veins of unspecified lower extremity: Secondary | ICD-10-CM | POA: Diagnosis present

## 2013-04-27 DIAGNOSIS — J9620 Acute and chronic respiratory failure, unspecified whether with hypoxia or hypercapnia: Secondary | ICD-10-CM | POA: Insufficient documentation

## 2013-04-27 DIAGNOSIS — N179 Acute kidney failure, unspecified: Secondary | ICD-10-CM | POA: Diagnosis present

## 2013-04-27 DIAGNOSIS — I82401 Acute embolism and thrombosis of unspecified deep veins of right lower extremity: Secondary | ICD-10-CM

## 2013-04-27 DIAGNOSIS — Z79899 Other long term (current) drug therapy: Secondary | ICD-10-CM

## 2013-04-27 DIAGNOSIS — J9611 Chronic respiratory failure with hypoxia: Secondary | ICD-10-CM | POA: Diagnosis present

## 2013-04-27 DIAGNOSIS — I824Z9 Acute embolism and thrombosis of unspecified deep veins of unspecified distal lower extremity: Secondary | ICD-10-CM | POA: Diagnosis present

## 2013-04-27 DIAGNOSIS — L988 Other specified disorders of the skin and subcutaneous tissue: Secondary | ICD-10-CM | POA: Diagnosis present

## 2013-04-27 DIAGNOSIS — I2781 Cor pulmonale (chronic): Secondary | ICD-10-CM | POA: Diagnosis present

## 2013-04-27 DIAGNOSIS — R609 Edema, unspecified: Secondary | ICD-10-CM | POA: Diagnosis present

## 2013-04-27 DIAGNOSIS — I10 Essential (primary) hypertension: Secondary | ICD-10-CM | POA: Diagnosis present

## 2013-04-27 DIAGNOSIS — E870 Hyperosmolality and hypernatremia: Secondary | ICD-10-CM | POA: Diagnosis present

## 2013-04-27 DIAGNOSIS — I2699 Other pulmonary embolism without acute cor pulmonale: Principal | ICD-10-CM | POA: Diagnosis present

## 2013-04-27 DIAGNOSIS — E785 Hyperlipidemia, unspecified: Secondary | ICD-10-CM | POA: Diagnosis present

## 2013-04-27 DIAGNOSIS — J8410 Pulmonary fibrosis, unspecified: Secondary | ICD-10-CM | POA: Diagnosis present

## 2013-04-27 DIAGNOSIS — IMO0002 Reserved for concepts with insufficient information to code with codable children: Secondary | ICD-10-CM

## 2013-04-27 DIAGNOSIS — S80821A Blister (nonthermal), right lower leg, initial encounter: Secondary | ICD-10-CM

## 2013-04-27 DIAGNOSIS — I2510 Atherosclerotic heart disease of native coronary artery without angina pectoris: Secondary | ICD-10-CM | POA: Diagnosis present

## 2013-04-27 DIAGNOSIS — Z7982 Long term (current) use of aspirin: Secondary | ICD-10-CM

## 2013-04-27 DIAGNOSIS — D72829 Elevated white blood cell count, unspecified: Secondary | ICD-10-CM | POA: Diagnosis present

## 2013-04-27 DIAGNOSIS — Z87891 Personal history of nicotine dependence: Secondary | ICD-10-CM | POA: Diagnosis present

## 2013-04-27 LAB — POCT I-STAT 3, ART BLOOD GAS (G3+)
Acid-Base Excess: 10 mmol/L — ABNORMAL HIGH (ref 0.0–2.0)
O2 Saturation: 95 %
Patient temperature: 98.7
pCO2 arterial: 52.7 mmHg — ABNORMAL HIGH (ref 35.0–45.0)
pH, Arterial: 7.439 (ref 7.350–7.450)
pO2, Arterial: 77 mmHg — ABNORMAL LOW (ref 80.0–100.0)

## 2013-04-27 LAB — URINALYSIS, ROUTINE W REFLEX MICROSCOPIC
Glucose, UA: NEGATIVE mg/dL
Hgb urine dipstick: NEGATIVE
Leukocytes, UA: NEGATIVE
Nitrite: NEGATIVE
Protein, ur: NEGATIVE mg/dL
Specific Gravity, Urine: 1.012 (ref 1.005–1.030)
Urobilinogen, UA: 0.2 mg/dL (ref 0.0–1.0)

## 2013-04-27 LAB — CBC WITH DIFFERENTIAL/PLATELET
Basophils Absolute: 0 10*3/uL (ref 0.0–0.1)
Basophils Relative: 0 % (ref 0–1)
Eosinophils Absolute: 0 10*3/uL (ref 0.0–0.7)
Eosinophils Relative: 0 % (ref 0–5)
Lymphocytes Relative: 3 % — ABNORMAL LOW (ref 12–46)
Lymphs Abs: 0.3 10*3/uL — ABNORMAL LOW (ref 0.7–4.0)
MCH: 30.9 pg (ref 26.0–34.0)
MCHC: 32.7 g/dL (ref 30.0–36.0)
Monocytes Absolute: 0.4 10*3/uL (ref 0.1–1.0)
Neutrophils Relative %: 94 % — ABNORMAL HIGH (ref 43–77)
Platelets: 190 10*3/uL (ref 150–400)
RBC: 3.85 MIL/uL — ABNORMAL LOW (ref 4.22–5.81)
RDW: 14.6 % (ref 11.5–15.5)

## 2013-04-27 LAB — COMPREHENSIVE METABOLIC PANEL
ALT: 15 U/L (ref 0–53)
AST: 21 U/L (ref 0–37)
Albumin: 3.8 g/dL (ref 3.5–5.2)
Alkaline Phosphatase: 67 U/L (ref 39–117)
CO2: 39 mEq/L — ABNORMAL HIGH (ref 19–32)
Calcium: 10.2 mg/dL (ref 8.4–10.5)
Creatinine, Ser: 1.5 mg/dL — ABNORMAL HIGH (ref 0.50–1.35)
Potassium: 3.9 mEq/L (ref 3.5–5.1)
Sodium: 144 mEq/L (ref 135–145)
Total Protein: 7.1 g/dL (ref 6.0–8.3)

## 2013-04-27 LAB — PROTIME-INR
INR: 1.02 (ref 0.00–1.49)
Prothrombin Time: 13.2 seconds (ref 11.6–15.2)

## 2013-04-27 LAB — TROPONIN I: Troponin I: <0.3 ng/mL (ref <0.30)

## 2013-04-27 MED ORDER — HEPARIN (PORCINE) IN NACL 100-0.45 UNIT/ML-% IJ SOLN
900.0000 [IU]/h | INTRAMUSCULAR | Status: AC
  Administered 2013-04-27: 1400 [IU]/h via INTRAVENOUS
  Administered 2013-04-28: 1300 [IU]/h via INTRAVENOUS
  Administered 2013-04-29: 900 [IU]/h via INTRAVENOUS
  Filled 2013-04-27 (×5): qty 250

## 2013-04-27 MED ORDER — WARFARIN - PHARMACIST DOSING INPATIENT
Freq: Every day | Status: DC
  Administered 2013-04-27: 18:00:00

## 2013-04-27 MED ORDER — TECHNETIUM TO 99M ALBUMIN AGGREGATED
6.0000 | Freq: Once | INTRAVENOUS | Status: AC | PRN
  Administered 2013-04-27: 6 via INTRAVENOUS

## 2013-04-27 MED ORDER — SODIUM CHLORIDE 0.9 % IJ SOLN
3.0000 mL | Freq: Two times a day (BID) | INTRAMUSCULAR | Status: DC
  Administered 2013-04-28 – 2013-05-01 (×7): 3 mL via INTRAVENOUS

## 2013-04-27 MED ORDER — FUROSEMIDE 10 MG/ML IJ SOLN
40.0000 mg | Freq: Once | INTRAMUSCULAR | Status: AC
  Administered 2013-04-27: 40 mg via INTRAVENOUS
  Filled 2013-04-27: qty 4

## 2013-04-27 MED ORDER — ACETAMINOPHEN 650 MG RE SUPP: 650.0000 mg | Freq: Four times a day (QID) | RECTAL | Status: DC | PRN

## 2013-04-27 MED ORDER — WARFARIN SODIUM 7.5 MG PO TABS
7.5000 mg | ORAL_TABLET | Freq: Once | ORAL | Status: AC
  Administered 2013-04-27: 7.5 mg via ORAL
  Filled 2013-04-27 (×2): qty 1

## 2013-04-27 MED ORDER — ENOXAPARIN SODIUM 100 MG/ML ~~LOC~~ SOLN
90.0000 mg | Freq: Once | SUBCUTANEOUS | Status: AC
  Administered 2013-04-27: 90 mg via SUBCUTANEOUS
  Filled 2013-04-27: qty 1

## 2013-04-27 MED ORDER — MORPHINE SULFATE 2 MG/ML IJ SOLN: 2.0000 mg | INTRAMUSCULAR | Status: DC | PRN

## 2013-04-27 MED ORDER — ONDANSETRON HCL 4 MG PO TABS: 4.0000 mg | ORAL_TABLET | Freq: Four times a day (QID) | ORAL | Status: DC | PRN

## 2013-04-27 MED ORDER — TECHNETIUM TC 99M DIETHYLENETRIAME-PENTAACETIC ACID: 40.0000 | Freq: Once | INTRAVENOUS | Status: AC | PRN

## 2013-04-27 MED ORDER — HEPARIN BOLUS VIA INFUSION
5000.0000 [IU] | Freq: Once | INTRAVENOUS | Status: AC
  Administered 2013-04-27: 5000 [IU] via INTRAVENOUS
  Filled 2013-04-27: qty 5000

## 2013-04-27 MED ORDER — ACETAMINOPHEN 325 MG PO TABS: 650.0000 mg | ORAL_TABLET | Freq: Four times a day (QID) | ORAL | Status: DC | PRN

## 2013-04-27 MED ORDER — ONDANSETRON HCL 4 MG/2ML IJ SOLN: 4.0000 mg | Freq: Three times a day (TID) | INTRAMUSCULAR | Status: DC | PRN

## 2013-04-27 MED ORDER — POLYETHYLENE GLYCOL 3350 17 G PO PACK
17.0000 g | PACK | Freq: Every day | ORAL | Status: DC | PRN
  Filled 2013-04-27: qty 1

## 2013-04-27 MED ORDER — ONDANSETRON HCL 4 MG/2ML IJ SOLN: 4.0000 mg | Freq: Four times a day (QID) | INTRAMUSCULAR | Status: DC | PRN

## 2013-04-27 NOTE — Consult Note (Addendum)
PHARMACY CONSULT NOTE  Pharmacy Consult:  Coumadin with Heparin bridging  Indication: New DVT  Allergies: No Known Allergies  Height/Weight: Weight: 199 lb 4.7 oz (90.4 kg) Dosing weight 90.4 kg  Vital Signs: BP 122/77  Pulse 81  Temp(Src) 98 F (36.7 C) (Oral)  Resp 24  Wt 199 lb 4.7 oz (90.4 kg)  BMI 28.6 kg/m2  SpO2 97%  Active Problems: Active Problems:   HYPERTENSION   PULMONARY FIBROSIS   Chronic respiratory failure with hypoxia   Acute-on-chronic respiratory failure   DVT, lower extremity   Leukocytosis   Cor pulmonale   Labs:  Recent Labs  04/27/13 1105  HGB 11.9*  HCT 36.4*  PLT 190  LABPROT 13.2  INR 1.02  CREATININE 1.50*   Lab Results  Component Value Date   INR 1.02 04/27/2013   The CrCl is unknown because both a height and weight (above a minimum accepted value) are required for this calculation.  Medical / Surgical History: Past Medical History  Diagnosis Date  . Hyperlipemia   . Hypertension   . Pulmonary fibrosis   . Coronary heart disease    Past Surgical History  Procedure Laterality Date  . Cardiac catheterization  1988, 2000, 2011  . Cataract extraction  01-08-12    Medications:  Prescriptions prior to admission  Medication Sig Dispense Refill  . Acetylcysteine (NAC 600) 600 MG CAPS Take by mouth. 2 in am and 1 at bedtime       . aspirin 325 MG tablet Take 325 mg by mouth daily.        Marland Kitchen atorvastatin (LIPITOR) 40 MG tablet Take 40 mg by mouth at bedtime.        Marland Kitchen azaTHIOprine (IMURAN) 50 MG tablet TAKE 1 TABLET BY MOUTH EVERY DAY  30 tablet  2  . B Complex-C-Folic Acid (FOLBEE PLUS PO) Take by mouth daily.        Marland Kitchen BYSTOLIC 10 MG tablet Take 10 mg by mouth Daily.      . furosemide (LASIX) 20 MG tablet Take 1 tablet (20 mg total) by mouth 2 (two) times daily.  60 tablet  6  . glucosamine-chondroitin 500-400 MG tablet Take 2 tablets by mouth daily.        Marland Kitchen KLOR-CON M20 20 MEQ tablet Take 1 tablet by mouth Daily.      .  Multiple Vitamin (MULTIVITAMIN) tablet Take 1 tablet by mouth daily.        Marland Kitchen NITROSTAT 0.4 MG SL tablet as needed.      . predniSONE (DELTASONE) 10 MG tablet TAKE 2 TABLETS BY MOUTH DAILY  60 tablet  6  . PROAIR HFA 108 (90 BASE) MCG/ACT inhaler Inhale 2 puffs into the lungs Every 6 hours as needed.       Scheduled:  . sodium chloride  3 mL Intravenous Q12H    Assessment:  67 y.o.male with chronic respiratory failure secondary to pulmonary fibrosis, home O2,  chronic diastolic heart failure found to have a newly diagnosed DVT.  Coumadin with Heparin bridging will be started.  Baseline INR 1.02, Hgb 11.9, Platelets 190.  Heparin dosing weight.  Coumadin Predictor Score = 6  Goal of Therapy:   INR 2-3  Heparin level 0.3-0.7 units/ml      Plan:   Heparin 5000 unit bolus, then begin Heparin infusion at 1400 units/hr.  Will check Heparin level in 6 hours  Coumadin 7.5 mg today. Daily Heparin Levels, INR, CBC.  Monitor for bleeding complications.  Johnesha Acheampong, Elisha Headland,  Pharm.D.. 04/27/2013,  4:36 PM  04/27/2013 , 10:02 PM    VQ scan reported with HIGH probability of PE.    Patient currently receiving treatment doses of Heparin via infusion.  No dose adjustments required at this time.  Next Heparin level ~ 2300 pm tonight.  Will adjust Heparin as indicated.  Andrew Soria, Elisha Headland,  Pharm.D.

## 2013-04-27 NOTE — ED Notes (Signed)
Bilateral LE are edematous and pt reports swelling has been present for "a while".  Right foot is cool to touch with sensation present.  Large, reddened circular with fluid filled blister area noted on medial aspect of right LE.

## 2013-04-27 NOTE — Progress Notes (Signed)
Patient Active Problem List   Diagnosis Date Noted  . Acute-on-chronic respiratory failure 04/27/2013  . DVT, lower extremity 04/27/2013  . Leukocytosis 04/27/2013  . Cor pulmonale 04/27/2013  . Chronic respiratory failure with hypoxia 07/06/2012  . Abnormal liver function 09/13/2010  . CORONARY HEART DISEASE 03/15/2010  . PULMONARY FIBROSIS 03/15/2010  . HYPERLIPIDEMIA 04/26/2007  . HYPERTENSION 04/26/2007   67 y/o chronic respiratory failure secondary to pulmonary fibrosis, home O2 dependent, also with chronic diastolic heart failure and right-sided heart failure. Seen by his lung specialist on the day of admission and was complaining of cough with adoptive sputum with generalized weakness, his pulmonologist thought there is a component of cor pulmonale decompensation so he was referred to ED and was found to to be mildly hypotensive. In the ED a basic metabolic panel was done that shows a creatinine 1.5, negative troponin, white count 13.2, with a left shift, chest x-ray showed no acute cardiopulmonary abnormalities, lower extremity Doppler shows a new DVT.   Will need echo, xray, labs with bnp/card enymes.

## 2013-04-27 NOTE — Progress Notes (Signed)
  Subjective:    Patient ID: Joshua Booth, male    DOB: 04/19/1946, 67 y.o.   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  Pt c/o increase SOB w/ activity. Pt saw PCP last Monday and yesterday for swelling in ankles. Pt is scheduled for ECHO tomorrow.  Has trouble with lower extremity swelling , worse for last few weeks. Cant not stand without severe dypsnea. On arrival today O2 sat 73% on 5 l/m.   Was started on Metalazone 5mg  daily last week, weight is down 7 lbs. But no change in leg swelling or dyspnea/DOE.  Also on Lasix 40mg  daily .  Lisinopril HCT was stopped. Last week by PCP .  Currently on prednisone 20mg  daily . Sleeps in recliner.  No cough, fever, hemoptysis , abdominal pain or n/v.  Has water blister/Bullae on right leg that ruptured last week from severe leg swelling.     Review of Systems Constitutional:   No  weight loss, night sweats,  Fevers, chills,  +fatigue, or  lassitude.  HEENT:   No headaches,  Difficulty swallowing,  Tooth/dental problems, or  Sore throat,                No sneezing, itching, ear ache, nasal congestion, post nasal drip,   CV:  No chest pain,  Orthopnea, PND,  , anasarca, dizziness, palpitations, syncope.   GI  No heartburn, indigestion, abdominal pain, nausea, vomiting, diarrhea, change in bowel habits, loss of appetite, bloody stools.   Resp:    No chest wall deformity  Skin: no rash or lesions.  GU: no dysuria, change in color of urine, no urgency or frequency.  No flank pain, no hematuria   MS:     No back pain.  Psych:  No change in mood or affect. No depression or anxiety.  No memory loss.         Objective:   Physical Exam GEN: A/Ox3; pleasant , NAD,chronically ill appearing male in wheelchair   HEENT:  [place]/AT,  EACs-clear, TMs-wnl, NOSE-clear, THROAT-clear, no lesions, no postnasal drip or exudate noted.    NECK:  Supple w/ fair ROM; no JVD; normal carotid impulses w/o bruits; no thyromegaly or nodules palpated; no lymphadenopathy.  RESP  Bibasilar crackles, , no accessory muscle use, no dullness to percussion  CARD:  RRR, no m/r/g  , 2=3+ pitting edema, pulses intact, no cyanosis or clubbing.  GI:   Soft & nt; nml bowel sounds; no organomegaly or masses detected.  Musco: Warm bil, no deformities or joint swelling noted.   Neuro: alert, no focal deficits noted.    Skin: Warm, ruptured bullae along medial right lower ext, no sign redness noted.          Assessment & Plan:

## 2013-04-27 NOTE — ED Notes (Signed)
Pt reports increased LE edema and SHOB.  Seen by PMD and sent to pulmonary for evaluation.  SAO2 75% 5LNC per FNP (Dr. Lynelle Doctor office)

## 2013-04-27 NOTE — ED Provider Notes (Signed)
CSN: [id number]     Arrival date & time 04/27/13  1041 History   First MD Initiated Contact with Patient 04/27/13 1050     Chief Complaint  Patient presents with  . Shortness of Breath  . Leg Swelling   (Consider location/radiation/quality/duration/timing/severity/associated sxs/prior Treatment) HPI Comments: Patient from pulmonary office with 3 week history of gradually worsening shortness of breath and lower extremity edema. He has a history of interstitial lung disease and cor pulmonale. He's been taking diuretics without improvement. He endorses dyspnea on exertion with orthopnea and PND. Denies any chest pain, cough or fever. Denies abdominal pain, nausea or vomiting. She has worsening of his lower extremity edema with weeping blistering his right leg. Left leg is more swollen than right.  The history is provided by the patient and a relative.    Past Medical History  Diagnosis Date  . Hyperlipemia   . Hypertension   . Pulmonary fibrosis   . Coronary heart disease    Past Surgical History  Procedure Laterality Date  . Cardiac catheterization  1988, 2000, 2011  . Cataract extraction  01-08-12   History reviewed. No pertinent family history. History  Substance Use Topics  . Smoking status: Former Smoker -- 1.00 packs/day for 20 years    Quit date: 08/06/1983  . Smokeless tobacco: Not on file  . Alcohol Use: No    Review of Systems  Constitutional: Positive for activity change, appetite change and fatigue. Negative for fever.  HENT: Negative for congestion and rhinorrhea.   Respiratory: Positive for cough and shortness of breath.   Cardiovascular: Positive for leg swelling. Negative for chest pain.  Gastrointestinal: Negative for nausea, vomiting and abdominal pain.  Genitourinary: Negative for dysuria and hematuria.  Musculoskeletal: Negative for back pain.  Skin: Negative for rash.  Neurological: Negative for dizziness, weakness and headaches.  A complete 10 system  review of systems was obtained and all systems are negative except as noted in the HPI and PMH.    Allergies  Review of patient's allergies indicates no known allergies.  Home Medications   No current outpatient prescriptions on file. BP 122/77  Pulse 81  Temp(Src) 98 F (36.7 C) (Oral)  Resp 24  Wt 199 lb 4.7 oz (90.4 kg)  BMI 28.6 kg/m2  SpO2 97% Physical Exam  Constitutional: He is oriented to person, place, and time. He appears well-developed and well-nourished. He appears distressed.  Mildly increased work of breathing, speaking in short sentences. No sensory muscle use  HENT:  Head: Normocephalic and atraumatic.  Mouth/Throat: Oropharynx is clear and moist. No oropharyngeal exudate.  Eyes: Conjunctivae and EOM are normal. Pupils are equal, round, and reactive to light.  Neck: Normal range of motion. Neck supple.  Cardiovascular: Normal rate, regular rhythm and normal heart sounds.   No murmur heard. Pulmonary/Chest: He is in respiratory distress.  Bilateral crackles  Abdominal: Soft. There is no tenderness. There is no rebound and no guarding.  Musculoskeletal: Normal range of motion. He exhibits edema. He exhibits no tenderness.  +3 pretibial edema left greater than right. Ruptured large blister on right medial leg. Intact +2 DP and PT pulses  Neurological: He is alert and oriented to person, place, and time. No cranial nerve deficit. He exhibits normal muscle tone. Coordination normal.  Skin: Skin is warm. Rash noted.    ED Course  Procedures (including critical care time) Labs Review Labs Reviewed  CBC WITH DIFFERENTIAL - Abnormal; Notable for the following:    WBC 13.2 (*)  RBC 3.85 (*)    Hemoglobin 11.9 (*)    HCT 36.4 (*)    Neutrophils Relative % 94 (*)    Neutro Abs 12.4 (*)    Lymphocytes Relative 3 (*)    Lymphs Abs 0.3 (*)    All other components within normal limits  COMPREHENSIVE METABOLIC PANEL - Abnormal; Notable for the following:    CO2  39 (*)    Glucose, Bld 121 (*)    BUN 48 (*)    Creatinine, Ser 1.50 (*)    GFR calc non Af Amer 46 (*)    GFR calc Af Amer 53 (*)    All other components within normal limits  PRO B NATRIURETIC PEPTIDE - Abnormal; Notable for the following:    Pro B Natriuretic peptide (BNP) 193.8 (*)    All other components within normal limits  POCT I-STAT 3, BLOOD GAS (G3+) - Abnormal; Notable for the following:    pCO2 arterial 52.7 (*)    pO2, Arterial 77.0 (*)    Bicarbonate 35.7 (*)    Acid-Base Excess 10.0 (*)    All other components within normal limits  TROPONIN I  URINALYSIS, ROUTINE W REFLEX MICROSCOPIC  PROTIME-INR   Imaging Review Dg Abd 1 View  04/27/2013   *RADIOLOGY REPORT*  Clinical Data: Evaluate for free air  ABDOMEN - 1 VIEW  Comparison: Chest radiograph - earlier same day  Findings:  There are irregular loculated lucencies within the right upper abdominal quadrant as demonstrated on chest radiograph performed earlier same day.  Paucity of bowel gas without definite evidence of obstruction.  No definite pneumatosis or portal venous gas.  Limited visualization of the lower thorax demonstrates coarse reticular opacities within the imaged left mid and lower lung.  Multilevel thoracolumbar spine degenerative change.  IMPRESSION: 1.  Indeterminate irregular loculated lucencies in the right upper abdominal quadrant are favored to represent either air within an interposed loop of bowel versus incomplete aeration of the right lung base.  If clinical concern persists for pneumoperitoneum, further evaluation with either the left lateral decubitus abdominal radiographs or abdominal CT is recommended. 2.  Suspected advanced fibrotic change within the imaged left mid and lower lung.  This was made a call report.   Original Report Authenticated By: Tacey Ruiz, MD   US Venous Img Lower Bilateral  04/27/2013   *RADIOLOGY REPORT*  Clinical Data: Shortness of breath and left greater than right lower  extremity swelling  BILATERAL LOWER EXTREMITY VENOUS DUPLEX ULTRASOUND  Technique:  Gray-scale sonography with graded compression, as well as color Doppler and duplex ultrasound, were performed to evaluate the deep venous system of both lower extremities from the level of the common femoral vein through the popliteal and proximal calf veins.  Spectral Doppler was utilized to evaluate flow at rest and with distal augmentation maneuvers.  Comparison:  None.  Findings:  Normal compressibility of bilateral common femoral, superficial femoral, and popliteal veins is demonstrated.  No filling defects to suggest DVT on grayscale or color Doppler imaging.  Doppler waveforms show normal direction of venous flow, normal respiratory phasicity and response to augmentation. Bilateral great saphenous veins are patent and compressible.  However, the right peroneal veins are noncompressible and internal flow cannot be demonstrated concerning for focal calf DVT. The right posterior tibial veins are patent and compressible.  There is superficial edema in the left lower extremity limiting evaluation of the calf veins.  IMPRESSION:  1.  Incompressibility of the right peroneal vein  concerning for focal calf DVT.  The posterior tibial veins, and popliteal and proximal veins remain patent.  2.  Edema in the superficial soft tissues limits evaluation of the left calf veins.  No evidence of left DVT to the popliteal level.  These results were called by telephone on 04/27/2013 at 12:40 p.m. to Dr. Laurey Arrow, who verbally acknowledged these results.   Original Report Authenticated By: Malachy Moan, M.D.   Dg Chest Portable 1 View  04/27/2013   CLINICAL DATA:  Shortness of breath. Bilateral lower extremity swelling. Low oxygen saturations. History of hypertension, pulmonary fibrosis, coronary heart disease.  EXAM: PORTABLE CHEST - 1 VIEW  COMPARISON:  04/14/2007  FINDINGS: Shallow lung inflation. Heart size is difficult to evaluate  because of significant pulmonary density. There has been progression of fibrotic change within the lung bases, left greater than right.  The right hemidiaphragm is elevated. There is question of free intraperitoneal air beneath the diaphragm. Alternatively, the findings could be related to an interposed loop of colon beneath the right hemidiaphragm. If there is also clinical concern regarding possible bowel perforation, further evaluation with left lateral decubitus view of the abdomen would be suggested.  IMPRESSION: 1. Progression of fibrotic changes bilaterally, left greater than right. 2. Question of free intraperitoneal air beneath right hemidiaphragm versus interposed loop of colon. See above.  These results will be called to the ordering clinician or representative by the Radiologist Assistant, and communication documented in the PACS Dashboard.   Electronically Signed   By: Rosalie Gums M.D.   On: 04/27/2013 11:30   Dg Abd Decub  04/27/2013   CLINICAL DATA:  Question of free air. Shortness of breath. Leg swelling.  EXAM: ABDOMEN - 1 VIEW DECUBITUS  COMPARISON:  04/27/2013  FINDINGS: Bowel gas pattern is unremarkable. With the patient in left lateral decubitus position, there is no evidence for free intraperitoneal air. Coarse honeycomb changes are identified at the lung bases.  IMPRESSION: No evidence for free intraperitoneal air.   Electronically Signed   By: Rosalie Gums M.D.   On: 04/27/2013 13:39    MDM   1. Pulmonary fibrosis   2. DVT (deep venous thrombosis), right    Worsening shortness of breath, orthopnea and leg swelling. History of pulmonary fibrosis and cor pulmonale.  CXR without edema.  Questionable free air versus bowel loop.  Abdomen soft without peritoneal signs. No free air on decubitus view.  Patient given IV lasix with good response. Doppler shows probable R DVT. lovenox given.  Cr 1.5.  Will hold on CTA of chest at this time as hemodynamically stable and lovenox already  given. Admission to [facility] d/w Dr. Robb Matar.   Date: 04/27/2013  Rate: 95  Rhythm: normal sinus rhythm  QRS Axis: normal  Intervals: normal  ST/T Wave abnormalities: nonspecific ST/T changes  Conduction Disutrbances:first-degree A-V block   Narrative Interpretation:   Old EKG Reviewed: none available  BP 122/77  Pulse 81  Temp(Src) 98 F (36.7 C) (Oral)  Resp 24  Wt 199 lb 4.7 oz (90.4 kg)  BMI 28.6 kg/m2  SpO2 97%     Glynn Octave, MD 04/27/13 [phone]

## 2013-04-27 NOTE — ED Notes (Signed)
Attempt to call report nurse not available 

## 2013-04-27 NOTE — H&P (Signed)
[facility] History and Physical  [name] XBJ:[id number] DOB: 01-07-1946 DOA: 04/27/2013  Referring physician: Dr. Manus Gunning PCP: Gildardo Griffes, MD  Specialists: Dr. Delford Field -> Pulmonologist  Chief Complaint: SOB  HPI: Joshua Booth is a 67 y.o. male  With a hx of pulmonary fibrosis followed by Dr. Delford Field, HTN, HLD, and CAD who presented from his Pulmonologist's office with gradually worsening sob and LE edema x 3 weeks. The patient otherwise feels "great." In the ED, the patient was noted to have increased LLE edema which was positive for DVT. A CXR was unremarkable and initial cardiac enzymes were neg. Given his worsening sob, the patient was transferred to [facility] for further treatment.  Review of Systems:  Per above, the remainder of the 10pt ROS reviewed and are neg  Past Medical History  Diagnosis Date  . Hyperlipemia   . Hypertension   . Pulmonary fibrosis   . Coronary heart disease    Past Surgical History  Procedure Laterality Date  . Cardiac catheterization  1988, 2000, 2011  . Cataract extraction  01-08-12   Social History:  reports that he quit smoking about 29 years ago. He does not have any smokeless tobacco history on file. He reports that he does not drink alcohol or use illicit drugs.  where does patient live--home, ALF, SNF? and with whom if at home?  Can patient participate in ADLs?  No Known Allergies  History reviewed. No pertinent family history.  (be sure to complete)  Prior to Admission medications   Medication Sig Start Date End Date Taking? Authorizing Provider  Acetylcysteine (NAC 600) 600 MG CAPS Take by mouth. 2 in am and 1 at bedtime     Historical Provider, MD  aspirin 325 MG tablet Take 325 mg by mouth daily.      Historical Provider, MD  atorvastatin (LIPITOR) 40 MG tablet Take 40 mg by mouth at bedtime.      Historical Provider, MD  azaTHIOprine (IMURAN) 50 MG tablet TAKE 1 TABLET BY MOUTH EVERY DAY 03/15/13   Storm Frisk, MD  B  Complex-C-Folic Acid (FOLBEE PLUS PO) Take by mouth daily.      Historical Provider, MD  BYSTOLIC 10 MG tablet Take 10 mg by mouth Daily. 07/22/11   Historical Provider, MD  furosemide (LASIX) 20 MG tablet Take 1 tablet (20 mg total) by mouth 2 (two) times daily. 12/01/12   Storm Frisk, MD  glucosamine-chondroitin 500-400 MG tablet Take 2 tablets by mouth daily.      Historical Provider, MD  KLOR-CON M20 20 MEQ tablet Take 1 tablet by mouth Daily. 01/02/12   Historical Provider, MD  Multiple Vitamin (MULTIVITAMIN) tablet Take 1 tablet by mouth daily.      Historical Provider, MD  NITROSTAT 0.4 MG SL tablet as needed. 09/20/11   Historical Provider, MD  predniSONE (DELTASONE) 10 MG tablet TAKE 2 TABLETS BY MOUTH DAILY 02/15/13   Storm Frisk, MD  PROAIR HFA 108 (90 BASE) MCG/ACT inhaler Inhale 2 puffs into the lungs Every 6 hours as needed.    Historical Provider, MD   Physical Exam: Filed Vitals:   04/27/13 1134 04/27/13 1356 04/27/13 1528 04/27/13 1531  BP: 125/63 114/67 122/77   Pulse: 93 74 81   Temp:   98 F (36.7 C)   TempSrc:   Oral   Resp: 28  24   Weight:    90.4 kg (199 lb 4.7 oz)  SpO2: 93% 97% 97%  General:  Awake, in nad  Eyes: PERRL B  ENT: membranes moist, dentition fair  Neck: trachea midline, neck supple  Cardiovascular: regular, s1, s2  Respiratory: slightly labored breaths, no wheezing or crackles  Abdomen: obese, soft, nondistended  Skin: normal skin turgor, no abnormal skin lesions seen  Musculoskeletal: perfused, no clubbing or cyanosis, LLE pitting edema with post calf tenderness  Psychiatric: mood/affect normal // no auditory/visual hallucinations  Neurologic: cn2-12 grossly intact, strength/sensation intact  Labs on Admission:  Basic Metabolic Panel:  Recent Labs Lab 04/27/13 1105  NA 144  K 3.9  CL 98  CO2 39*  GLUCOSE 121*  BUN 48*  CREATININE 1.50*  CALCIUM 10.2   Liver Function Tests:  Recent Labs Lab 04/27/13 1105   AST 21  ALT 15  ALKPHOS 67  BILITOT 0.5  PROT 7.1  ALBUMIN 3.8   No results found for this basename: LIPASE, AMYLASE,  in the last 168 hours No results found for this basename: AMMONIA,  in the last 168 hours CBC:  Recent Labs Lab 04/27/13 1105  WBC 13.2*  NEUTROABS 12.4*  HGB 11.9*  HCT 36.4*  MCV 94.5  PLT 190   Cardiac Enzymes:  Recent Labs Lab 04/27/13 1105  TROPONINI <0.30    BNP (last 3 results)  Recent Labs  04/27/13 1105  PROBNP 193.8*   CBG: No results found for this basename: GLUCAP,  in the last 168 hours  Radiological Exams on Admission: Dg Abd 1 View  04/27/2013   *RADIOLOGY REPORT*  Clinical Data: Evaluate for free air  ABDOMEN - 1 VIEW  Comparison: Chest radiograph - earlier same day  Findings:  There are irregular loculated lucencies within the right upper abdominal quadrant as demonstrated on chest radiograph performed earlier same day.  Paucity of bowel gas without definite evidence of obstruction.  No definite pneumatosis or portal venous gas.  Limited visualization of the lower thorax demonstrates coarse reticular opacities within the imaged left mid and lower lung.  Multilevel thoracolumbar spine degenerative change.  IMPRESSION: 1.  Indeterminate irregular loculated lucencies in the right upper abdominal quadrant are favored to represent either air within an interposed loop of bowel versus incomplete aeration of the right lung base.  If clinical concern persists for pneumoperitoneum, further evaluation with either the left lateral decubitus abdominal radiographs or abdominal CT is recommended. 2.  Suspected advanced fibrotic change within the imaged left mid and lower lung.  This was made a call report.   Original Report Authenticated By: Tacey Ruiz, MD   US Venous Img Lower Bilateral  04/27/2013   *RADIOLOGY REPORT*  Clinical Data: Shortness of breath and left greater than right lower extremity swelling  BILATERAL LOWER EXTREMITY VENOUS DUPLEX  ULTRASOUND  Technique:  Gray-scale sonography with graded compression, as well as color Doppler and duplex ultrasound, were performed to evaluate the deep venous system of both lower extremities from the level of the common femoral vein through the popliteal and proximal calf veins.  Spectral Doppler was utilized to evaluate flow at rest and with distal augmentation maneuvers.  Comparison:  None.  Findings:  Normal compressibility of bilateral common femoral, superficial femoral, and popliteal veins is demonstrated.  No filling defects to suggest DVT on grayscale or color Doppler imaging.  Doppler waveforms show normal direction of venous flow, normal respiratory phasicity and response to augmentation. Bilateral great saphenous veins are patent and compressible.  However, the right peroneal veins are noncompressible and internal flow cannot be demonstrated concerning for  focal calf DVT. The right posterior tibial veins are patent and compressible.  There is superficial edema in the left lower extremity limiting evaluation of the calf veins.  IMPRESSION:  1.  Incompressibility of the right peroneal vein concerning for focal calf DVT.  The posterior tibial veins, and popliteal and proximal veins remain patent.  2.  Edema in the superficial soft tissues limits evaluation of the left calf veins.  No evidence of left DVT to the popliteal level.  These results were called by telephone on 04/27/2013 at 12:40 p.m. to Dr. Laurey Arrow, who verbally acknowledged these results.   Original Report Authenticated By: Malachy Moan, M.D.   Dg Chest Portable 1 View  04/27/2013   CLINICAL DATA:  Shortness of breath. Bilateral lower extremity swelling. Low oxygen saturations. History of hypertension, pulmonary fibrosis, coronary heart disease.  EXAM: PORTABLE CHEST - 1 VIEW  COMPARISON:  04/14/2007  FINDINGS: Shallow lung inflation. Heart size is difficult to evaluate because of significant pulmonary density. There has been  progression of fibrotic change within the lung bases, left greater than right.  The right hemidiaphragm is elevated. There is question of free intraperitoneal air beneath the diaphragm. Alternatively, the findings could be related to an interposed loop of colon beneath the right hemidiaphragm. If there is also clinical concern regarding possible bowel perforation, further evaluation with left lateral decubitus view of the abdomen would be suggested.  IMPRESSION: 1. Progression of fibrotic changes bilaterally, left greater than right. 2. Question of free intraperitoneal air beneath right hemidiaphragm versus interposed loop of colon. See above.  These results will be called to the ordering clinician or representative by the Radiologist Assistant, and communication documented in the PACS Dashboard.   Electronically Signed   By: Rosalie Gums M.D.   On: 04/27/2013 11:30   Dg Abd Decub  04/27/2013   CLINICAL DATA:  Question of free air. Shortness of breath. Leg swelling.  EXAM: ABDOMEN - 1 VIEW DECUBITUS  COMPARISON:  04/27/2013  FINDINGS: Bowel gas pattern is unremarkable. With the patient in left lateral decubitus position, there is no evidence for free intraperitoneal air. Coarse honeycomb changes are identified at the lung bases.  IMPRESSION: No evidence for free intraperitoneal air.   Electronically Signed   By: Rosalie Gums M.D.   On: 04/27/2013 13:39    Assessment/Plan Active Problems:   HYPERTENSION   PULMONARY FIBROSIS   Chronic respiratory failure with hypoxia   Acute-on-chronic respiratory failure   DVT, lower extremity   Leukocytosis   Cor pulmonale  1. LLE DVT 1. Will start coumadin with heparin bridge 2. Ordered VQ to r/o PE 2. Pulm fibrosis 1. Would continue home regimen 2. Cont O2 as needed 3. Dyspnea 1. Hx of pulm fibrosis and cor pulmonale 2. CXR unremarkable 3. Per above, ordered VQ to eval for PE 4. Held off on ordering CTA secondary to renal insufficiency 5. On therapeutic  anticoagulation per above 6. Will check 2D echo - last echo was one year ago 7. Cont O2 4. HTN: 1. BP currently controlled 2. Would continue current regimen 5. Leukocytosis 1. Left shift noted. Afebrile 2. Pt is on chronic steroids 3. No cough or sputum production 4. Would observe for now.  5. If becomes febrile, consider empiric tx for CAP  Code Status: Full (must indicate code status--if unknown or must be presumed, indicate so) Family Communication: Pt in room (indicate person spoken with, if applicable, with phone number if by telephone) Disposition Plan: Pending (indicate anticipated  LOS)  Time spent:  CHIU, STEPHEN K [facility] Pager [phone]  If 7PM-7AM, please contact night-coverage [email or web address] Password [facility] 04/27/2013, 4:14 PM

## 2013-04-27 NOTE — Progress Notes (Signed)
Paged Dr. David Stall regarding patient's arrival.

## 2013-04-27 NOTE — Assessment & Plan Note (Signed)
Acute on chronic Hypoxic resp failure complicated by severe IPF  Suspect decompensated cor pulmonale. Unresponsive to OP tx plan with diuresis Will need admission to hospital for further evaluation and treatment.  Will need echo, xray, labs with bnp/card enymes. Consider CT chest -PE protocol if scr is improved.  Diuresis as scr /b/p allows.  Unfortunately both [facility] and [facility] are in holding pattern for beds.  Will send to [facility] ER for evaluation and transfer to [facility] when bed availability arises.

## 2013-04-27 NOTE — Progress Notes (Signed)
Dr Eustaquio Boyden called, VQ Scan showed high probability of PE.  Pt already on Heparin GTT@14unit /hr.  Donnamarie Poag NP, informed, also discussed with Phillips Climes, Pharm-D.  No new orders received at this time, next Heparin level due at 2300, will continue to monitor.

## 2013-04-28 DIAGNOSIS — R0902 Hypoxemia: Secondary | ICD-10-CM

## 2013-04-28 DIAGNOSIS — I82409 Acute embolism and thrombosis of unspecified deep veins of unspecified lower extremity: Secondary | ICD-10-CM

## 2013-04-28 DIAGNOSIS — I517 Cardiomegaly: Secondary | ICD-10-CM

## 2013-04-28 DIAGNOSIS — J9620 Acute and chronic respiratory failure, unspecified whether with hypoxia or hypercapnia: Secondary | ICD-10-CM

## 2013-04-28 DIAGNOSIS — J8410 Pulmonary fibrosis, unspecified: Secondary | ICD-10-CM

## 2013-04-28 DIAGNOSIS — J9610 Chronic respiratory failure, unspecified whether with hypoxia or hypercapnia: Secondary | ICD-10-CM

## 2013-04-28 DIAGNOSIS — I279 Pulmonary heart disease, unspecified: Secondary | ICD-10-CM

## 2013-04-28 DIAGNOSIS — I2699 Other pulmonary embolism without acute cor pulmonale: Secondary | ICD-10-CM | POA: Diagnosis present

## 2013-04-28 LAB — CBC
HCT: 34.8 % — ABNORMAL LOW (ref 39.0–52.0)
Hemoglobin: 11.2 g/dL — ABNORMAL LOW (ref 13.0–17.0)
MCH: 30 pg (ref 26.0–34.0)
MCV: 93.3 fL (ref 78.0–100.0)
Platelets: 191 10*3/uL (ref 150–400)
RBC: 3.73 MIL/uL — ABNORMAL LOW (ref 4.22–5.81)
WBC: 10.6 10*3/uL — ABNORMAL HIGH (ref 4.0–10.5)

## 2013-04-28 LAB — PROTIME-INR
INR: 1.13 (ref 0.00–1.49)
Prothrombin Time: 14.2 seconds (ref 11.6–15.2)

## 2013-04-28 LAB — COMPREHENSIVE METABOLIC PANEL
AST: 19 U/L (ref 0–37)
Albumin: 3.7 g/dL (ref 3.5–5.2)
BUN: 41 mg/dL — ABNORMAL HIGH (ref 6–23)
Chloride: 100 mEq/L (ref 96–112)
Creatinine, Ser: 1.3 mg/dL (ref 0.50–1.35)
GFR calc Af Amer: 64 mL/min — ABNORMAL LOW (ref >90)
Potassium: 3.6 mEq/L (ref 3.5–5.1)
Total Bilirubin: 0.7 mg/dL (ref 0.3–1.2)

## 2013-04-28 LAB — HEPARIN LEVEL (UNFRACTIONATED): Heparin Unfractionated: 1.54 IU/mL — ABNORMAL HIGH (ref 0.30–0.70)

## 2013-04-28 MED ORDER — COUMADIN BOOK
Freq: Once | Status: AC
  Administered 2013-04-28: 20:00:00
  Filled 2013-04-28 (×2): qty 1

## 2013-04-28 MED ORDER — FUROSEMIDE 10 MG/ML IJ SOLN
40.0000 mg | Freq: Three times a day (TID) | INTRAMUSCULAR | Status: DC
  Administered 2013-04-28 – 2013-04-29 (×6): 40 mg via INTRAVENOUS
  Filled 2013-04-28 (×11): qty 4

## 2013-04-28 MED ORDER — PREDNISONE 20 MG PO TABS
20.0000 mg | ORAL_TABLET | Freq: Every day | ORAL | Status: DC
  Administered 2013-04-28 – 2013-05-02 (×5): 20 mg via ORAL
  Filled 2013-04-28 (×5): qty 1

## 2013-04-28 MED ORDER — WARFARIN SODIUM 7.5 MG PO TABS
7.5000 mg | ORAL_TABLET | Freq: Once | ORAL | Status: AC
  Administered 2013-04-28: 7.5 mg via ORAL
  Filled 2013-04-28: qty 1

## 2013-04-28 MED ORDER — WARFARIN VIDEO
Freq: Once | Status: AC
  Administered 2013-04-28: 12:00:00

## 2013-04-28 MED ORDER — PERFLUTREN LIPID MICROSPHERE
1.0000 mL | INTRAVENOUS | Status: AC | PRN
  Administered 2013-04-28: 2 mL via INTRAVENOUS
  Filled 2013-04-28: qty 10

## 2013-04-28 MED ORDER — POTASSIUM CHLORIDE CRYS ER 20 MEQ PO TBCR
40.0000 meq | EXTENDED_RELEASE_TABLET | Freq: Two times a day (BID) | ORAL | Status: DC
  Administered 2013-04-28 (×2): 40 meq via ORAL
  Filled 2013-04-28 (×4): qty 2

## 2013-04-28 NOTE — Progress Notes (Addendum)
[facility] PROGRESS NOTE  Joshua Booth WGN:[id number] DOB: April 25, 1946 DOA: 04/27/2013 PCP: Gildardo Griffes, MD  Assessment/Plan: 1. LLE DVT with mod to high probability of PE on VQ scan -continue anticoagulation with Heparin/coumadin  -pulm consult  2. Corpulmonale /R heart failure -diurese with IV lasix 40mg  q8, add KCL -I/Os, weights -check 2 D ECHO -ECHO last year with normal EF and severe Pulm HTN  3. DYspnea -multifactorial from IPF, ? PE, and R heart failure  4. HTN -stable  5. H/o CAD -stable, continue ASA/Bystolic, statin  6. Leukocytosis -likely reactive, afebrile, improving  7. RLE blister/seeping fluid -wound RN consult, diurese  DVT proph:  On anticoagulation  Code Status: FUll Family Communication:none at bedside Disposition Plan: Home when improved   Consultants:  Pulm pending  HPI/Subjective: Dyspnea with any activity  Objective: Filed Vitals:   04/28/13 0437  BP: 120/60  Pulse: 72  Temp: 97.6 F (36.4 C)  Resp: 24    Intake/Output Summary (Last 24 hours) at 04/28/13 0758 Last data filed at 04/28/13 0440  Gross per 24 hour  Intake    240 ml  Output   2250 ml  Net  -2010 ml   Filed Weights   04/27/13 1531 04/28/13 0500  Weight: 90.4 kg (199 lb 4.7 oz) 90.2 kg (198 lb 13.7 oz)    Exam:   General:  AAOx3, mild distress  Cardiovascular: S1S2/RRR  Respiratory: fine crackles throughout, worse at bases  Abdomen: soft, Nt, BS present  Musculoskeletal: 2-3plus edema, blister in RLE  Data Reviewed: Basic Metabolic Panel:  Recent Labs Lab 04/27/13 1105  NA 144  K 3.9  CL 98  CO2 39*  GLUCOSE 121*  BUN 48*  CREATININE 1.50*  CALCIUM 10.2   Liver Function Tests:  Recent Labs Lab 04/27/13 1105  AST 21  ALT 15  ALKPHOS 67  BILITOT 0.5  PROT 7.1  ALBUMIN 3.8   No results found for this basename: LIPASE, AMYLASE,  in the last 168 hours No results found for this basename: AMMONIA,  in the last 168  hours CBC:  Recent Labs Lab 04/27/13 1105 04/28/13 0642  WBC 13.2* 10.6*  NEUTROABS 12.4*  --   HGB 11.9* 11.2*  HCT 36.4* 34.8*  MCV 94.5 93.3  PLT 190 191   Cardiac Enzymes:  Recent Labs Lab 04/27/13 1105  TROPONINI <0.30   BNP (last 3 results)  Recent Labs  04/27/13 1105  PROBNP 193.8*   CBG: No results found for this basename: GLUCAP,  in the last 168 hours  No results found for this or any previous visit (from the past 240 hour(s)).   Studies: Dg Abd 1 View  04/27/2013   *RADIOLOGY REPORT*  Clinical Data: Evaluate for free air  ABDOMEN - 1 VIEW  Comparison: Chest radiograph - earlier same day  Findings:  There are irregular loculated lucencies within the right upper abdominal quadrant as demonstrated on chest radiograph performed earlier same day.  Paucity of bowel gas without definite evidence of obstruction.  No definite pneumatosis or portal venous gas.  Limited visualization of the lower thorax demonstrates coarse reticular opacities within the imaged left mid and lower lung.  Multilevel thoracolumbar spine degenerative change.  IMPRESSION: 1.  Indeterminate irregular loculated lucencies in the right upper abdominal quadrant are favored to represent either air within an interposed loop of bowel versus incomplete aeration of the right lung base.  If clinical concern persists for pneumoperitoneum, further evaluation with either the left lateral decubitus abdominal radiographs  or abdominal CT is recommended. 2.  Suspected advanced fibrotic change within the imaged left mid and lower lung.  This was made a call report.   Original Report Authenticated By: Tacey Ruiz, MD   Nm Pulmonary Perf And Vent  04/27/2013   CLINICAL DATA:  Shortness of breath.  EXAM: NUCLEAR MEDICINE VENTILATION - PERFUSION LUNG SCAN  TECHNIQUE: Ventilation images were obtained in multiple projections using inhaled aerosol technetium 99 M DTPA. Perfusion images were obtained in multiple projections  after intravenous injection of Tc-70m MAA.  COMPARISON:  Plain film of the chest 04/27/2013 at 10:57 a.m.  RADIOPHARMACEUTICALS:  6 mCi Tc-38m DTPA aerosol and 40 mCi Tc-28m MAA  FINDINGS: Ventilation: Inhalation imaging is severely limited due to technical factors.  Perfusion: Wedge-shaped, subsegmental peripheral defects are seen in the right upper lobe and left upper lobe.  IMPRESSION: Limited study due to nondiagnostic inhalation imaging is intermediate to high probability for pulmonary embolus. Critical Value/emergent results were called by telephone at the time of interpretation on 04/27/2013 at 9:35 PMto Marissa, the patient's nurse , who verbally acknowledged these results.   Electronically Signed   By: Drusilla Kanner M.D.   On: 04/27/2013 21:36   US Venous Img Lower Bilateral  04/27/2013   *RADIOLOGY REPORT*  Clinical Data: Shortness of breath and left greater than right lower extremity swelling  BILATERAL LOWER EXTREMITY VENOUS DUPLEX ULTRASOUND  Technique:  Gray-scale sonography with graded compression, as well as color Doppler and duplex ultrasound, were performed to evaluate the deep venous system of both lower extremities from the level of the common femoral vein through the popliteal and proximal calf veins.  Spectral Doppler was utilized to evaluate flow at rest and with distal augmentation maneuvers.  Comparison:  None.  Findings:  Normal compressibility of bilateral common femoral, superficial femoral, and popliteal veins is demonstrated.  No filling defects to suggest DVT on grayscale or color Doppler imaging.  Doppler waveforms show normal direction of venous flow, normal respiratory phasicity and response to augmentation. Bilateral great saphenous veins are patent and compressible.  However, the right peroneal veins are noncompressible and internal flow cannot be demonstrated concerning for focal calf DVT. The right posterior tibial veins are patent and compressible.  There is superficial  edema in the left lower extremity limiting evaluation of the calf veins.  IMPRESSION:  1.  Incompressibility of the right peroneal vein concerning for focal calf DVT.  The posterior tibial veins, and popliteal and proximal veins remain patent.  2.  Edema in the superficial soft tissues limits evaluation of the left calf veins.  No evidence of left DVT to the popliteal level.  These results were called by telephone on 04/27/2013 at 12:40 p.m. to Dr. Laurey Arrow, who verbally acknowledged these results.   Original Report Authenticated By: Malachy Moan, M.D.   Dg Chest Portable 1 View  04/27/2013   CLINICAL DATA:  Shortness of breath. Bilateral lower extremity swelling. Low oxygen saturations. History of hypertension, pulmonary fibrosis, coronary heart disease.  EXAM: PORTABLE CHEST - 1 VIEW  COMPARISON:  04/14/2007  FINDINGS: Shallow lung inflation. Heart size is difficult to evaluate because of significant pulmonary density. There has been progression of fibrotic change within the lung bases, left greater than right.  The right hemidiaphragm is elevated. There is question of free intraperitoneal air beneath the diaphragm. Alternatively, the findings could be related to an interposed loop of colon beneath the right hemidiaphragm. If there is also clinical concern regarding possible bowel perforation,  further evaluation with left lateral decubitus view of the abdomen would be suggested.  IMPRESSION: 1. Progression of fibrotic changes bilaterally, left greater than right. 2. Question of free intraperitoneal air beneath right hemidiaphragm versus interposed loop of colon. See above.  These results will be called to the ordering clinician or representative by the Radiologist Assistant, and communication documented in the PACS Dashboard.   Electronically Signed   By: Rosalie Gums M.D.   On: 04/27/2013 11:30   Dg Abd Decub  04/27/2013   CLINICAL DATA:  Question of free air. Shortness of breath. Leg swelling.   EXAM: ABDOMEN - 1 VIEW DECUBITUS  COMPARISON:  04/27/2013  FINDINGS: Bowel gas pattern is unremarkable. With the patient in left lateral decubitus position, there is no evidence for free intraperitoneal air. Coarse honeycomb changes are identified at the lung bases.  IMPRESSION: No evidence for free intraperitoneal air.   Electronically Signed   By: Rosalie Gums M.D.   On: 04/27/2013 13:39    Scheduled Meds: . furosemide  40 mg Intravenous Q8H  . potassium chloride  40 mEq Oral BID  . sodium chloride  3 mL Intravenous Q12H  . Warfarin - Pharmacist Dosing Inpatient   Does not apply q1800   Continuous Infusions: . heparin 1,300 Units/hr (04/28/13 0419)    Active Problems:   HYPERTENSION   PULMONARY FIBROSIS   Chronic respiratory failure with hypoxia   Acute-on-chronic respiratory failure   DVT, lower extremity   Leukocytosis   Cor pulmonale    Time spent:    [facility]  [facility] Pager [phone]. If 7PM-7AM, please contact night-coverage at [email or web address], password [facility] 04/28/2013, 7:58 AM  LOS: 1 day

## 2013-04-28 NOTE — Care Management Note (Unsigned)
    Page 1 of 1   04/30/2013     4:14:10 PM   CARE MANAGEMENT NOTE 04/30/2013  Patient:  Joshua Booth, Joshua Booth   Account Number:  [id number]  Date Initiated:  04/28/2013  Documentation initiated by:  Aruna Nestler  Subjective/Objective Assessment:   PT ADM ON 04/27/13 WITH PE/DVT.  PTA, PT LIVES ALONE AND IS ON HOME OXYGEN.     Action/Plan:   PT/OT CONSULTS PENDING.  WILL FOLLOW FOR DISCHARGE NEEDS AS PT PROGRESSES.   Anticipated DC Date:  05/02/2013   Anticipated DC Plan:  HOME W HOME HEALTH SERVICES      DC Planning Services  CM consult      Choice offered to / List presented to:     DME arranged  Levan Hurst      DME agency  [facility]        Status of service:  In process, will continue to follow Medicare Important Message given?   (If response is "NO", the following Medicare IM given date fields will be blank) Date Medicare IM given:   Date Additional Medicare IM given:    Discharge Disposition:    Per UR Regulation:  Reviewed for med. necessity/level of care/duration of stay  If discussed at Long Length of Stay Meetings, dates discussed:    Comments:  04/29/13 Maximos Zayas,RN,BSN [phone] CM REFERRAL TO CHECK XARELTO COVERAGE.  PER [facility] AT [facility], (PT'S PHARMACY), PT WILL HAS $30.03 COPAY FOR 15MG  DOSE, AND $43.00 COPAY FOR 20MG  DOSE.  PT STATES HE CAN AFFORD THESE COPAYS.  NOTIFIED DR Jomarie Longs OF THIS. PT WILL LIKELY NEED HH FOLLOW UP AT DC; NEEDS ROLLATOR WALKER AS RECOMMENDED BY P.T.  WILL FOLLOW UP.  COMPLETED PREAUTHORIZATION FOR XARELTO THROUGH [facility].  PHONE [phone].

## 2013-04-28 NOTE — Consult Note (Signed)
PULMONARY  / CRITICAL CARE MEDICINE  Name: Joshua Booth MRN: [id number] DOB: Dec 13, 1945    ADMISSION DATE:  04/27/2013 CONSULTATION DATE: 04/28/2013  REFERRING MD :  Jomarie Longs  PRIMARY SERVICE:  [facility]  CHIEF COMPLAINT:  Pulmonary Emboli in setting IPF  BRIEF PATIENT DESCRIPTION: 67 y.o. male with a hx of pulmonary fibrosis diagnosed over 10 years via biopsy and  followed by Dr. Delford Field, HTN, HLD, and CAD who presented from his Pulmonologist's office with gradually worsening SOB and LE edema x 3 weeks on 04/27/2013.  SIGNIFICANT EVENTS / STUDIES:  9/23 NM Pulmonary Perf and Vent: high prob apical 9/23 US venous Img Lower Bilateral: RLE DVT    LINES / TUBES: PIV 9/23  CULTURES: None  ANTIBIOTICS: None   HISTORY OF PRESENT ILLNESS:   Patient was sent to ED for evaluation of increased left lower ankle edema and progressive dyspnea x two weeks by PCP 9/23.He was  found to have right peroneal veins DVT. CXR was unremarkable and initial cardiac enzymes negative.The patient was transferred to [facility] for further treatment.  After admision, patient began to compain of increased SOB and the feeling of congestion in his chest. Perfusion lung scanned  completed 9/23 revealed wedge-shaped, subsegmental peripheral defects in  the right upper lobe and left upper lobe garnering high suspicion for PE. Patient has been placed on LMWH and coumadin (9/23). He is currently on 5 L [place], comfortable at rest; however becomes SOB with conversation and exertion. Leukocytosis improving from 13.2 upon admission to 10.6. He remains afebrile and weight remains stable (90.2 -90.4 kg).    PAST MEDICAL HISTORY :  Past Medical History  Diagnosis Date  . Hyperlipemia   . Hypertension   . Pulmonary fibrosis   . Coronary heart disease    Past Surgical History  Procedure Laterality Date  . Cardiac catheterization  1988, 2000, 2011  . Cataract extraction  01-08-12   Prior to Admission medications   Medication Sig  Start Date End Date Taking? Authorizing Provider  Acetylcysteine (NAC 600) 600 MG CAPS Take by mouth. 2 in am and 1 at bedtime    Yes Historical Provider, MD  aspirin 325 MG tablet Take 325 mg by mouth daily.     Yes Historical Provider, MD  atorvastatin (LIPITOR) 40 MG tablet Take 40 mg by mouth at bedtime.     Yes Historical Provider, MD  azaTHIOprine (IMURAN) 50 MG tablet Take 50 mg by mouth daily.   Yes Historical Provider, MD  B Complex-C-Folic Acid (FOLBEE PLUS PO) Take by mouth daily.     Yes Historical Provider, MD  BYSTOLIC 10 MG tablet Take 10 mg by mouth Daily. 07/22/11  Yes Historical Provider, MD  furosemide (LASIX) 20 MG tablet Take 1 tablet (20 mg total) by mouth 2 (two) times daily. 12/01/12  Yes Storm Frisk, MD  glucosamine-chondroitin 500-400 MG tablet Take 2 tablets by mouth daily.     Yes Historical Provider, MD  KLOR-CON M20 20 MEQ tablet Take 10 mEq by mouth 2 (two) times daily.  01/02/12  Yes Historical Provider, MD  Multiple Vitamin (MULTIVITAMIN) tablet Take 1 tablet by mouth daily.     Yes Historical Provider, MD  NITROSTAT 0.4 MG SL tablet Place 0.4 mg under the tongue every 5 (five) minutes as needed for chest pain.  09/20/11  Yes Historical Provider, MD  predniSONE (DELTASONE) 10 MG tablet Take 20 mg by mouth daily.   Yes Historical Provider, MD   No Known  Allergies  FAMILY HISTORY:  History reviewed. No pertinent family history. SOCIAL HISTORY:  reports that he quit smoking about 29 years ago. He does not have any smokeless tobacco history on file. He reports that he does not drink alcohol or use illicit drugs.  REVIEW OF SYSTEMS:   Constitutional: Positive  mlaise/fatigue, negative for  fever, chills, weight loss,and diaphoresis.  HENT: Negative for hearing loss, ear pain, nosebleeds, congestion, sore throat, neck pain, tinnitus and ear discharge.   Eyes: Negative for blurred vision, double vision, photophobia, pain, discharge and redness.  Respiratory:  Positive for SOB, wheezing. Negative for cough, hemoptysis, sputum production.   Cardiovascular: Positive for  orthopnea,bilateral lower leg edema. 3+ edema lower left extremity. 2+ edema lower right leg.Negative for chest pain, palpitations,claudication, and PND.  Gastrointestinal: Negative for heartburn, nausea, vomiting, abdominal pain, diarrhea, constipation, blood in stool and melena.  Genitourinary: Negative for dysuria, urgency, frequency, hematuria and flank pain.  Musculoskeletal: Negative for myalgias, back pain, joint pain and falls.  Skin: Negative for itching and rash.  Neurological: Negative for dizziness, tingling, tremors, sensory change, speech change, focal weakness, seizures, loss of consciousness, weakness and headaches.  Endo/Heme/Allergies: Negative for environmental allergies and polydipsia. Does not bruise/bleed easily.  SUBJECTIVE:   VITAL SIGNS: Temp:  [97.6 F (36.4 C)-98.7 F (37.1 C)] 97.6 F (36.4 C) (09/24 0437) Pulse Rate:  [72-94] 72 (09/24 0437) Resp:  [24-28] 24 (09/24 0437) BP: (96-125)/(52-77) 120/60 mmHg (09/24 0437) SpO2:  [92 %-100 %] 100 % (09/24 0437) Weight:  [90.2 kg (198 lb 13.7 oz)-92.987 kg (205 lb)] 90.2 kg (198 lb 13.7 oz) (09/24 0500)  PHYSICAL EXAMINATION  General:  AAOx3, mild distress, no focal deficits.  Cardiovascular: S1S2/RRR, soft early diastolic decresendo murmur heard at left sternal border. Respiratory:  fine crackles throughout, worse at bases. Rubbing heard throughout.  Abdomen:  Protuburent, soft, normoactive BS x4.   Skin: Pale, cool, lower extremity edema. Open blister present on right lower leg. Clean, dry dressing currently in place.    Recent Labs Lab 04/27/13 1105 04/28/13 0642  NA 144 146*  K 3.9 3.6  CL 98 100  CO2 39* 38*  BUN 48* 41*  CREATININE 1.50* 1.30  GLUCOSE 121* 119*    Recent Labs Lab 04/27/13 1105 04/28/13 0642  HGB 11.9* 11.2*  HCT 36.4* 34.8*  WBC 13.2* 10.6*  PLT 190 191   Dg  Abd 1 View  04/27/2013   *RADIOLOGY REPORT*  Clinical Data: Evaluate for free air  ABDOMEN - 1 VIEW  Comparison: Chest radiograph - earlier same day  Findings:  There are irregular loculated lucencies within the right upper abdominal quadrant as demonstrated on chest radiograph performed earlier same day.  Paucity of bowel gas without definite evidence of obstruction.  No definite pneumatosis or portal venous gas.  Limited visualization of the lower thorax demonstrates coarse reticular opacities within the imaged left mid and lower lung.  Multilevel thoracolumbar spine degenerative change.  IMPRESSION: 1.  Indeterminate irregular loculated lucencies in the right upper abdominal quadrant are favored to represent either air within an interposed loop of bowel versus incomplete aeration of the right lung base.  If clinical concern persists for pneumoperitoneum, further evaluation with either the left lateral decubitus abdominal radiographs or abdominal CT is recommended. 2.  Suspected advanced fibrotic change within the imaged left mid and lower lung.  This was made a call report.   Original Report Authenticated By: Tacey Ruiz, MD   Nm Pulmonary Perf And  Vent  04/27/2013   CLINICAL DATA:  Shortness of breath.  EXAM: NUCLEAR MEDICINE VENTILATION - PERFUSION LUNG SCAN  TECHNIQUE: Ventilation images were obtained in multiple projections using inhaled aerosol technetium 99 M DTPA. Perfusion images were obtained in multiple projections after intravenous injection of Tc-38m MAA.  COMPARISON:  Plain film of the chest 04/27/2013 at 10:57 a.m.  RADIOPHARMACEUTICALS:  6 mCi Tc-5m DTPA aerosol and 40 mCi Tc-43m MAA  FINDINGS: Ventilation: Inhalation imaging is severely limited due to technical factors.  Perfusion: Wedge-shaped, subsegmental peripheral defects are seen in the right upper lobe and left upper lobe.  IMPRESSION: Limited study due to nondiagnostic inhalation imaging is intermediate to high probability for  pulmonary embolus. Critical Value/emergent results were called by telephone at the time of interpretation on 04/27/2013 at 9:35 PMto Marissa, the patient's nurse , who verbally acknowledged these results.   Electronically Signed   By: Drusilla Kanner M.D.   On: 04/27/2013 21:36   US Venous Img Lower Bilateral  04/27/2013   *RADIOLOGY REPORT*  Clinical Data: Shortness of breath and left greater than right lower extremity swelling  BILATERAL LOWER EXTREMITY VENOUS DUPLEX ULTRASOUND  Technique:  Gray-scale sonography with graded compression, as well as color Doppler and duplex ultrasound, were performed to evaluate the deep venous system of both lower extremities from the level of the common femoral vein through the popliteal and proximal calf veins.  Spectral Doppler was utilized to evaluate flow at rest and with distal augmentation maneuvers.  Comparison:  None.  Findings:  Normal compressibility of bilateral common femoral, superficial femoral, and popliteal veins is demonstrated.  No filling defects to suggest DVT on grayscale or color Doppler imaging.  Doppler waveforms show normal direction of venous flow, normal respiratory phasicity and response to augmentation. Bilateral great saphenous veins are patent and compressible.  However, the right peroneal veins are noncompressible and internal flow cannot be demonstrated concerning for focal calf DVT. The right posterior tibial veins are patent and compressible.  There is superficial edema in the left lower extremity limiting evaluation of the calf veins.  IMPRESSION:  1.  Incompressibility of the right peroneal vein concerning for focal calf DVT.  The posterior tibial veins, and popliteal and proximal veins remain patent.  2.  Edema in the superficial soft tissues limits evaluation of the left calf veins.  No evidence of left DVT to the popliteal level.  These results were called by telephone on 04/27/2013 at 12:40 p.m. to Dr. Laurey Arrow, who verbally  acknowledged these results.   Original Report Authenticated By: Malachy Moan, M.D.   Dg Chest Portable 1 View  04/27/2013   CLINICAL DATA:  Shortness of breath. Bilateral lower extremity swelling. Low oxygen saturations. History of hypertension, pulmonary fibrosis, coronary heart disease.  EXAM: PORTABLE CHEST - 1 VIEW  COMPARISON:  04/14/2007  FINDINGS: Shallow lung inflation. Heart size is difficult to evaluate because of significant pulmonary density. There has been progression of fibrotic change within the lung bases, left greater than right.  The right hemidiaphragm is elevated. There is question of free intraperitoneal air beneath the diaphragm. Alternatively, the findings could be related to an interposed loop of colon beneath the right hemidiaphragm. If there is also clinical concern regarding possible bowel perforation, further evaluation with left lateral decubitus view of the abdomen would be suggested.  IMPRESSION: 1. Progression of fibrotic changes bilaterally, left greater than right. 2. Question of free intraperitoneal air beneath right hemidiaphragm versus interposed loop of colon. See above.  These results will be called to the ordering clinician or representative by the Radiologist Assistant, and communication documented in the PACS Dashboard.   Electronically Signed   By: Rosalie Gums M.D.   On: 04/27/2013 11:30   Dg Abd Decub  04/27/2013   CLINICAL DATA:  Question of free air. Shortness of breath. Leg swelling.  EXAM: ABDOMEN - 1 VIEW DECUBITUS  COMPARISON:  04/27/2013  FINDINGS: Bowel gas pattern is unremarkable. With the patient in left lateral decubitus position, there is no evidence for free intraperitoneal air. Coarse honeycomb changes are identified at the lung bases.  IMPRESSION: No evidence for free intraperitoneal air.   Electronically Signed   By: Rosalie Gums M.D.   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   Plan:  Follow-up ECHO, likely will show elevated chronic pressures pulm HTN which has been protective for PE  Tolerability overall Continue anticoagulation  Wean supplemental oxygen as tolerated Continue diuresis as tolerated by, he is lasix dependent at home [place] does not show mobile clot, hence no role filter Would follow pcxr in am for volume status and risk infaction i see no role abx at this stage Appears to be pred 20 daily home, restart in setting additional resp compromise  LE DVT Plan: See pulm section re: anticoagulation  Mild AKI: improved Plan: Follow cr after diuresis  Free water encourage  Hypernatremia  Should improve w/ po intake, free water Plan: F/u bmp  Pulmonary and Critical Care [facility] Pager: [phone]  04/28/2013, 8:57 AM  Pedro Earls NP-S   I have fully examined this patient and agree with above findings.    And edited in full  Dr Delford Field will see in am   Mcarthur Rossetti. Tyson Alias, MD, FACP Pgr: [phone] [facility]

## 2013-04-28 NOTE — Progress Notes (Signed)
Heparin level 1.54.  Heparin turned off per order from Pharmacy.  Pt resting, rise and fall of chest noted.  Will continue to monitor.   Shelbie Ammons, RN

## 2013-04-28 NOTE — Progress Notes (Signed)
Pt coughed up dime-size amount of pink-tinged sputum. [facility] paged at [phone]. A practitioner called me back and the information was shared. No new orders received. Will continue to monitor.  Asher Muir Dylynn Ketner,RN

## 2013-04-28 NOTE — Progress Notes (Signed)
Nutrition Brief Note  Patient identified on the Malnutrition Screening Tool (MST) Report  Wt Readings from Last 15 Encounters:  04/28/13 198 lb 13.7 oz (90.2 kg)  04/27/13 205 lb (92.987 kg)  12/10/12 212 lb (96.163 kg)  07/06/12 213 lb (96.616 kg)  03/12/12 221 lb (100.245 kg)  01/09/12 210 lb (95.255 kg)  10/17/11 236 lb (107.049 kg)  08/15/11 235 lb (106.595 kg)  03/11/11 233 lb (105.688 kg)  09/13/10 225 lb (102.059 kg)  07/05/10 235 lb (106.595 kg)  05/08/10 234 lb (106.142 kg)  03/15/10 240 lb (108.863 kg)    Body mass index is 28.53 kg/(m^2). Patient meets criteria for overweight based on current BMI.   Current diet order is Heart Healthy, patient is consuming approximately 100% of meals at this time. Labs and medications reviewed.   Pt states that he has lost ~50 lbs over the past several years by eating healthier.  Pt states that his portion sizes are smaller than they once were.  Pt believes he gets adequate nutrition.  He is now ~200 lbs after fluid loss. Denies education needs. No nutrition interventions warranted at this time. If nutrition issues arise, please consult RD.   Loyce Dys, MS RD LDN Clinical Inpatient Dietitian Pager: [phone] Weekend/After hours pager: [phone]

## 2013-04-28 NOTE — Progress Notes (Signed)
ANTICOAGULATION CONSULT NOTE - Follow Up Consult  Pharmacy Consult for Heparin and Coumadin Indication: DVT with mod to high probability of PE on VQ scan  No Known Allergies  Patient Measurements: Weight: 198 lb 13.7 oz (90.2 kg) Heparin Dosing Weight: 90.2kg  Vital Signs: Temp: 98 F (36.7 C) (09/24 1458) Temp src: Oral (09/24 1458) BP: 131/70 mmHg (09/24 1458) Pulse Rate: 82 (09/24 1458)  Labs:  Recent Labs  04/27/13 1105 04/28/13 0016 04/28/13 0642 04/28/13 1425  HGB 11.9*  --  11.2*  --   HCT 36.4*  --  34.8*  --   PLT 190  --  191  --   LABPROT 13.2  --   --  14.2  INR 1.02  --   --  1.13  HEPARINUNFRC  --  1.54*  --  1.22*  CREATININE 1.50*  --  1.30  --   TROPONINI <0.30  --   --   --     The CrCl is unknown because both a height and weight (above a minimum accepted value) are required for this calculation.   Medications:  Heparin 1300 units/hr   Assessment: 67yom on heparin bridging to Coumadin (Day 2 of minimum 5) for DVT with mod to high probability of PE on VQ scan. Heparin level (1.22) remains elevated despite holding drip x 2 hours and reducing dose. Patient did receive Lovenox 90 mg on 9/23 ~1300 but that should be having minimal effects on the most recent heparin level. RN verified that lab was drawn from opposite arm of infusion and reports no bleeding. Will plan to hold heparin drip x 1.5 hours and decrease rate.  INR (1.13) is subtherapeutic as expected after Coumadin 7.5mg  x 1 - will repeat dose and follow-up AM INR. - H/H and Plts stable - No significant bleeding reported  Goal of Therapy:  INR 2-3 Heparin level 0.3-0.7 units/ml Monitor platelets by anticoagulation protocol: Yes   Plan:  1. Hold Heparin drip x ~1.5hrs, then restart heparin drip 1100 units/hr (11 ml/hr) @ 2015 2. Check heparin level ~ 0400 tomorrow AM 3. Repeat Coumadin 7.5mg  po x 1 today 4. Daily INR, heparin level and CBC  Cleon Dew [phone] 04/28/2013,6:44 PM

## 2013-04-28 NOTE — Progress Notes (Signed)
ANTICOAGULATION CONSULT NOTE - Initial Consult  Pharmacy Consult for Heparin Indication: DVT/PE  No Known Allergies  Patient Measurements: Weight: 199 lb 4.7 oz (90.4 kg)  Vital Signs: Temp: 98.2 F (36.8 C) (09/23 1935) Temp src: Oral (09/23 1935) BP: 109/63 mmHg (09/23 1935) Pulse Rate: 88 (09/23 1935)  Labs:  Recent Labs  04/27/13 1105 04/28/13 0016  HGB 11.9*  --   HCT 36.4*  --   PLT 190  --   LABPROT 13.2  --   INR 1.02  --   HEPARINUNFRC  --  1.54*  CREATININE 1.50*  --   TROPONINI <0.30  --     The CrCl is unknown because both a height and weight (above a minimum accepted value) are required for this calculation.  Assessment: 67 yo male with new PE/DVT for heparin.  Received Lovenox 90 mg SQ at [facility] at 1 pm yesterday.  Heparin bolus/infusion started at [facility] at 1700.  Residual Lovenox increasing the anti-Xa level.  Goal of Therapy:  Heparin level 0.3-0.7 units/ml Monitor platelets by anticoagulation protocol: Yes   Plan:  Hold heparin x 2 hours, then decrease heparin 1300 units/hr Recheck level this afternoon  [place] Gustafson, Gary Fleet 04/28/2013,1:58 AM

## 2013-04-29 DIAGNOSIS — S80821A Blister (nonthermal), right lower leg, initial encounter: Secondary | ICD-10-CM

## 2013-04-29 DIAGNOSIS — I2699 Other pulmonary embolism without acute cor pulmonale: Principal | ICD-10-CM

## 2013-04-29 DIAGNOSIS — I10 Essential (primary) hypertension: Secondary | ICD-10-CM

## 2013-04-29 LAB — BASIC METABOLIC PANEL
BUN: 41 mg/dL — ABNORMAL HIGH (ref 6–23)
Creatinine, Ser: 1.36 mg/dL — ABNORMAL HIGH (ref 0.50–1.35)
GFR calc Af Amer: 60 mL/min — ABNORMAL LOW (ref >90)
GFR calc non Af Amer: 52 mL/min — ABNORMAL LOW (ref >90)
Glucose, Bld: 101 mg/dL — ABNORMAL HIGH (ref 70–99)
Potassium: 4.9 mEq/L (ref 3.5–5.1)
Sodium: 142 mEq/L (ref 135–145)

## 2013-04-29 LAB — PROTIME-INR
INR: 1.23 (ref 0.00–1.49)
Prothrombin Time: 15.2 seconds (ref 11.6–15.2)

## 2013-04-29 LAB — CBC
HCT: 35.3 % — ABNORMAL LOW (ref 39.0–52.0)
Hemoglobin: 11.5 g/dL — ABNORMAL LOW (ref 13.0–17.0)
MCHC: 32.6 g/dL (ref 30.0–36.0)
RBC: 3.79 MIL/uL — ABNORMAL LOW (ref 4.22–5.81)
RDW: 14.7 % (ref 11.5–15.5)

## 2013-04-29 MED ORDER — POTASSIUM CHLORIDE CRYS ER 20 MEQ PO TBCR
40.0000 meq | EXTENDED_RELEASE_TABLET | Freq: Every day | ORAL | Status: DC
  Administered 2013-04-29 – 2013-05-01 (×3): 40 meq via ORAL
  Filled 2013-04-29 (×3): qty 2

## 2013-04-29 MED ORDER — ATORVASTATIN CALCIUM 40 MG PO TABS
40.0000 mg | ORAL_TABLET | Freq: Every day | ORAL | Status: DC
  Administered 2013-04-29 – 2013-05-01 (×3): 40 mg via ORAL
  Filled 2013-04-29 (×4): qty 1

## 2013-04-29 MED ORDER — WARFARIN SODIUM 7.5 MG PO TABS
7.5000 mg | ORAL_TABLET | Freq: Once | ORAL | Status: DC
  Filled 2013-04-29: qty 1

## 2013-04-29 MED ORDER — NEBIVOLOL HCL 10 MG PO TABS
10.0000 mg | ORAL_TABLET | Freq: Every day | ORAL | Status: DC
  Administered 2013-04-30 – 2013-05-02 (×3): 10 mg via ORAL
  Filled 2013-04-29 (×3): qty 1

## 2013-04-29 MED ORDER — RIVAROXABAN 15 MG PO TABS
15.0000 mg | ORAL_TABLET | Freq: Two times a day (BID) | ORAL | Status: DC
  Administered 2013-04-29 – 2013-05-02 (×6): 15 mg via ORAL
  Filled 2013-04-29 (×8): qty 1

## 2013-04-29 NOTE — Progress Notes (Signed)
ANTICOAGULATION CONSULT NOTE   Pharmacy Consult for Heparin Indication: DVT/PE  No Known Allergies  Patient Measurements: Weight: 195 lb 15.8 oz (88.9 kg)  Vital Signs: Temp: 98.1 F (36.7 C) (09/25 0612) Temp src: Oral (09/25 0612) BP: 116/63 mmHg (09/25 0612) Pulse Rate: 78 (09/25 0612)  Labs:  Recent Labs  04/27/13 1105 04/28/13 0016 04/28/13 0642 04/28/13 1425 04/29/13 0635  HGB 11.9*  --  11.2*  --  11.5*  HCT 36.4*  --  34.8*  --  35.3*  PLT 190  --  191  --  199  LABPROT 13.2  --   --  14.2 15.2  INR 1.02  --   --  1.13 1.23  HEPARINUNFRC  --  1.54*  --  1.22* 1.01*  CREATININE 1.50*  --  1.30  --   --   TROPONINI <0.30  --   --   --   --     The CrCl is unknown because both a height and weight (above a minimum accepted value) are required for this calculation.  Assessment: 67 yo male with PE/DVT for heparin.   Goal of Therapy:  Heparin level 0.3-0.7 units/ml Monitor platelets by anticoagulation protocol: Yes   Plan:  Hold heparin x 1 hour, then decrease heparin 900 units/hr Recheck level this afternoon  Eddie Candle 04/29/2013,7:45 AM

## 2013-04-29 NOTE — Progress Notes (Addendum)
PULMONARY  / CRITICAL CARE MEDICINE  Name: Joshua Booth MRN: [id number] DOB: 03/31/1946    ADMISSION DATE:  04/27/2013 CONSULTATION DATE: 04/28/2013  REFERRING MD :  Jomarie Longs  PRIMARY SERVICE:  [facility]  CHIEF COMPLAINT:  Pulmonary Emboli in setting IPF  BRIEF PATIENT DESCRIPTION: 67 y.o. male with a hx of pulmonary fibrosis diagnosed over 10 years via biopsy and  followed by Dr. Delford Field, HTN, HLD, and CAD who presented from his Pulmonologist's office with gradually worsening SOB and LE edema x 3 weeks on 04/27/2013.  SIGNIFICANT EVENTS / STUDIES:  9/23 NM Pulmonary Perf and Vent: high prob apical 9/23 US venous Img Lower Bilateral: RLE DVT  9/24 ECHO: Wall thickness was increased in a pattern of mild LVH. Systolic function was normal. The estimated ejection fraction was in the range of 55% to 60%. Wall motion was normal; there were no regional wall motion abnormalities. Definity echo contrast was used. Doppler parameters are consistent with abnormal left ventricular relaxation (grade 1 diastolic dysfunction).Pulmonary arteries: PA peak pressure: 47mm Hg (S).  LINES / TUBES:  CULTURES: None  ANTIBIOTICS: None    SUBJECTIVE:  Feels better   VITAL SIGNS: Temp:  [97.5 F (36.4 C)-98.1 F (36.7 C)] 98.1 F (36.7 C) (09/25 0612) Pulse Rate:  [75-82] 78 (09/25 0612) Resp:  [18-22] 20 (09/25 0612) BP: (116-131)/(63-75) 116/63 mmHg (09/25 0612) SpO2:  [96 %-100 %] 100 % (09/25 0612) Weight:  [88.9 kg (195 lb 15.8 oz)] 88.9 kg (195 lb 15.8 oz) (09/25 0612) 4 liters  PHYSICAL EXAMINATION  General:  AAOx3, mild distress, no focal deficits.  Cardiovascular: S1S2/RRR, soft early diastolic decresendo murmur heard at left sternal border. Respiratory: velcro crackles Abdomen:  Protuburent, soft, normoactive BS x4.   Skin: Pale, cool, lower extremity edema. Open blister present on right lower leg. Clean, dry dressing currently in place.    Recent Labs Lab 04/27/13 1105 04/28/13 0642  04/29/13 0635  NA 144 146* 142  K 3.9 3.6 4.9  CL 98 100 97  CO2 39* 38* 39*  BUN 48* 41* 41*  CREATININE 1.50* 1.30 1.36*  GLUCOSE 121* 119* 101*    Recent Labs Lab 04/27/13 1105 04/28/13 0642 04/29/13 0635  HGB 11.9* 11.2* 11.5*  HCT 36.4* 34.8* 35.3*  WBC 13.2* 10.6* 9.1  PLT 190 191 199   Dg Abd 1 View  04/27/2013   *RADIOLOGY REPORT*  Clinical Data: Evaluate for free air  ABDOMEN - 1 VIEW  Comparison: Chest radiograph - earlier same day  Findings:  There are irregular loculated lucencies within the right upper abdominal quadrant as demonstrated on chest radiograph performed earlier same day.  Paucity of bowel gas without definite evidence of obstruction.  No definite pneumatosis or portal venous gas.  Limited visualization of the lower thorax demonstrates coarse reticular opacities within the imaged left mid and lower lung.  Multilevel thoracolumbar spine degenerative change.  IMPRESSION: 1.  Indeterminate irregular loculated lucencies in the right upper abdominal quadrant are favored to represent either air within an interposed loop of bowel versus incomplete aeration of the right lung base.  If clinical concern persists for pneumoperitoneum, further evaluation with either the left lateral decubitus abdominal radiographs or abdominal CT is recommended. 2.  Suspected advanced fibrotic change within the imaged left mid and lower lung.  This was made a call report.   Original Report Authenticated By: Tacey Ruiz, MD   Nm Pulmonary Perf And Vent  04/27/2013   CLINICAL DATA:  Shortness of breath.  EXAM: NUCLEAR MEDICINE VENTILATION - PERFUSION LUNG SCAN  TECHNIQUE: Ventilation images were obtained in multiple projections using inhaled aerosol technetium 99 M DTPA. Perfusion images were obtained in multiple projections after intravenous injection of Tc-28m MAA.  COMPARISON:  Plain film of the chest 04/27/2013 at 10:57 a.m.  RADIOPHARMACEUTICALS:  6 mCi Tc-25m DTPA aerosol and 40 mCi Tc-48m  MAA  FINDINGS: Ventilation: Inhalation imaging is severely limited due to technical factors.  Perfusion: Wedge-shaped, subsegmental peripheral defects are seen in the right upper lobe and left upper lobe.  IMPRESSION: Limited study due to nondiagnostic inhalation imaging is intermediate to high probability for pulmonary embolus. Critical Value/emergent results were called by telephone at the time of interpretation on 04/27/2013 at 9:35 PMto Marissa, the patient's nurse , who verbally acknowledged these results.   Electronically Signed   By: Drusilla Kanner M.D.   On: 04/27/2013 21:36   US Venous Img Lower Bilateral  04/27/2013   *RADIOLOGY REPORT*  Clinical Data: Shortness of breath and left greater than right lower extremity swelling  BILATERAL LOWER EXTREMITY VENOUS DUPLEX ULTRASOUND  Technique:  Gray-scale sonography with graded compression, as well as color Doppler and duplex ultrasound, were performed to evaluate the deep venous system of both lower extremities from the level of the common femoral vein through the popliteal and proximal calf veins.  Spectral Doppler was utilized to evaluate flow at rest and with distal augmentation maneuvers.  Comparison:  None.  Findings:  Normal compressibility of bilateral common femoral, superficial femoral, and popliteal veins is demonstrated.  No filling defects to suggest DVT on grayscale or color Doppler imaging.  Doppler waveforms show normal direction of venous flow, normal respiratory phasicity and response to augmentation. Bilateral great saphenous veins are patent and compressible.  However, the right peroneal veins are noncompressible and internal flow cannot be demonstrated concerning for focal calf DVT. The right posterior tibial veins are patent and compressible.  There is superficial edema in the left lower extremity limiting evaluation of the calf veins.  IMPRESSION:  1.  Incompressibility of the right peroneal vein concerning for focal calf DVT.  The  posterior tibial veins, and popliteal and proximal veins remain patent.  2.  Edema in the superficial soft tissues limits evaluation of the left calf veins.  No evidence of left DVT to the popliteal level.  These results were called by telephone on 04/27/2013 at 12:40 p.m. to Dr. Laurey Arrow, who verbally acknowledged these results.   Original Report Authenticated By: Malachy Moan, M.D.   Dg Chest Portable 1 View  04/27/2013   CLINICAL DATA:  Shortness of breath. Bilateral lower extremity swelling. Low oxygen saturations. History of hypertension, pulmonary fibrosis, coronary heart disease.  EXAM: PORTABLE CHEST - 1 VIEW  COMPARISON:  04/14/2007  FINDINGS: Shallow lung inflation. Heart size is difficult to evaluate because of significant pulmonary density. There has been progression of fibrotic change within the lung bases, left greater than right.  The right hemidiaphragm is elevated. There is question of free intraperitoneal air beneath the diaphragm. Alternatively, the findings could be related to an interposed loop of colon beneath the right hemidiaphragm. If there is also clinical concern regarding possible bowel perforation, further evaluation with left lateral decubitus view of the abdomen would be suggested.  IMPRESSION: 1. Progression of fibrotic changes bilaterally, left greater than right. 2. Question of free intraperitoneal air beneath right hemidiaphragm versus interposed loop of colon. See above.  These results will be called to the ordering clinician or representative by  the Radiologist Assistant, and communication documented in the PACS Dashboard.   Electronically Signed   By: Rosalie Gums M.D.   On: 04/27/2013 11:30   Dg Abd Decub  04/27/2013   CLINICAL DATA:  Question of free air. Shortness of breath. Leg swelling.  EXAM: ABDOMEN - 1 VIEW DECUBITUS  COMPARISON:  04/27/2013  FINDINGS: Bowel gas pattern is unremarkable. With the patient in left lateral decubitus position, there is no  evidence for free intraperitoneal air. Coarse honeycomb changes are identified at the lung bases.  IMPRESSION: No evidence for free intraperitoneal air.   Electronically Signed   By: Rosalie Gums M.D.   On: 04/27/2013 13:39    ASSESSMENT / PLAN: Acute on chronic respiratory failure likely d/t decompensated Cor pulmonale in the setting of acute PE complicated by severe IPF, also concerning body habitus and possible sleep apnea.  Lab Results  Component Value Date   INR 1.23 04/29/2013   INR 1.13 04/28/2013   INR 1.02 04/27/2013   Plan:  Continue anticoagulation --> will d/w team re: xarelto, given mild PAH by ECHO will likely benefit from life long anticoagulation >>>will check copay  Wean supplemental oxygen as tolerated Would follow pcxr in am for volume status and risk infaction Cont home pred dosing for IPF  LE DVT Plan: See pulm section re: anticoagulation  Mild AKI: Recent Labs     04/27/13  1105  04/28/13  0642  04/29/13  0635  CREATININE  1.50*  1.30  1.36*  sl bump w/ diuresis Plan: Cont current lasix dosing (on this at home) may need lasix holiday if scr cont to rise.   Hypernatremia    Recent Labs Lab 04/27/13 1105 04/28/13 0642 04/29/13 0635  NA 144 146* 142  improved w/ po self-regulation Plan: F/u bmp  Mild anemia  Recent Labs Lab 04/27/13 1105 04/28/13 0642 04/29/13 0635  HGB 11.9* 11.2* 11.5*  plan: Trend cbc on anticoagulation  Wound/Blister RLE Needs wound care consult RLE blister    Caryl Bis  [phone]  Cell  [phone]  If no response or cell goes to voicemail, call beeper [phone]  Pulmonary and Critical Care Medicine [facility] Pager: [phone]  04/29/2013, 8:24 AM

## 2013-04-29 NOTE — Consult Note (Signed)
WOC consult Note Reason for Consult: evaluation of bulla right leg.  Pt with ruptured bulla right medial calf. Pt with right peroneal vein concerning for focal calf DVT and mod to high probability of PE on VQ scan.  He has LE edema bilaterally with the left leg > right. He has worn compression socks in the past, but most recently he was not been able to don them. Palpable pulses bilaterally.  Wound type: bulla right medial calf, secondary to edema Measurement: 9cm x 11cm x 0 Wound bed: ruptured bulla with roof intact Drainage (amount, consistency, odor) none Periwound: intact Dressing procedure/placement/frequency: Vaseline gauze to protect this area, wrap with kerlix.  Long term believe that this pt will need compression stockings to manage his edema, however with current dx of DVT and PE, will not compress at this time.  I have advised him once he is clear medically for compression stockings he will need them long term.   Discussed POC with patient and bedside nurse.  Re consult if needed, will not follow at this time. Thanks  Ellis Koffler [employer], CWOCN [phone])

## 2013-04-29 NOTE — Evaluation (Signed)
Occupational Therapy Evaluation Patient Details Name: Joshua Booth MRN: [id number] DOB: 09-Dec-1945 Today's Date: 04/29/2013 Time: 1332-1410 OT Time Calculation (min): 38 min  OT Assessment / Plan / Recommendation History of present illness With a hx of pulmonary fibrosis followed by Dr. Delford Booth, HTN, HLD, and CAD who presented from his Pulmonologist's office with gradually worsening sob and LE edema x 3 weeks. The patient otherwise feels "great." In the ED, the patient was noted to have increased RLE edema which was positive for DVT.Marland Kitchen High probability PE   Clinical Impression   Pt with low activity tolerance, but functioning at a supervision level.  Educated pt at length in energy conservation strategies and use of DME and AE.  [facility] to address IADL in the home setting as pt lives alone.   OT Assessment  All further OT needs can be met in the next venue of care (HHOT to instruct in IADL in the home setting)    Follow Up Recommendations  Home health OT    Barriers to Discharge      Equipment Recommendations  None recommended by OT    Recommendations for Other Services    Frequency       Precautions / Restrictions Precautions Precaution Comments: watch O2 sats Restrictions Weight Bearing Restrictions: No   Pertinent Vitals/Pain VSS, no pain   ADL  Eating/Feeding: Independent Where Assessed - Eating/Feeding: Chair Grooming: Wash/dry hands;Supervision/safety Where Assessed - Grooming: Supported standing Upper Body Bathing: Set up Where Assessed - Upper Body Bathing: Unsupported sitting Lower Body Bathing: Supervision/safety Where Assessed - Lower Body Bathing: Supported sit to stand Upper Body Dressing: Set up Where Assessed - Upper Body Dressing: Unsupported sitting Lower Body Dressing: Supervision/safety Where Assessed - Lower Body Dressing: Supported sit to Pharmacist, hospital: Supervision/safety Statistician Method: Sit to Writer: Materials engineer and Hygiene: Supervision/safety Where Assessed - Engineer, mining and Hygiene: Sit to stand from 3-in-1 or toilet Equipment Used: Rolling walker Transfers/Ambulation Related to ADLs: supervision with RW ADL Comments: Pt able to cross foot over opposite knee to access feet.  Educated extensively in energy conservation and provided handout.  Recommended AE for donning compression stockings and reacher for energy conservation.    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 Delford Booth, HTN, HLD, and CAD who presented from his Pulmonologist's office with gradually worsening sob and LE edema x 3 weeks. The patient otherwise feels "great." In the ED, the patient was noted to have increased RLE edema which was positive for DVT.Marland Kitchen High probability PE       Prior Functioning     Home Living Family/patient expects to be discharged to:: Private residence Living Arrangements: Alone Type of Home: House Home Access: Stairs to enter [employer] of Steps: 2 Entrance Stairs-Rails: Right Home Layout: One level Home Equipment: Bedside commode;Tub bench;Hand held shower head Prior Function Level of Independence: Independent Communication Communication: No difficulties Dominant Hand: Right         Vision/Perception Vision - History Baseline Vision:  Wears glasses only for reading Patient Visual Report: No change from baseline   Cognition   Cognition Arousal/Alertness: Awake/alert Behavior During Therapy: WFL for tasks assessed/performed Overall Cognitive Status: Within Functional Limits for tasks assessed    Extremity/Trunk Assessment Upper Extremity Assessment Upper Extremity Assessment: Overall WFL for tasks assessed Lower Extremity Assessment Lower Extremity Assessment: Defer to PT evaluation Cervical / Trunk Assessment Cervical / Trunk Assessment: Normal     Mobility Bed Mobility Bed Mobility: Supine to Sit Supine to Sit: 6: Modified independent (Device/Increase time);HOB elevated;With rails Transfers Sit to Stand: 5: Supervision;From elevated surface;From chair/3-in-1 Stand to Sit: 5: Supervision;With upper extremity assist;To chair/3-in-1 Details for Transfer Assistance: cueing for hand placement with use of RW     Exercise     Balance     End of Session OT - End of Session Activity Tolerance: Patient limited by fatigue Patient left: in chair;with call bell/phone within reach;with nursing/sitter in room  GO     Joshua Booth 04/29/2013, 2:44 PM [phone]

## 2013-04-29 NOTE — Progress Notes (Signed)
[facility] PROGRESS NOTE  JERROLD HASKELL YNW:[id number] DOB: 12-03-1945 DOA: 04/27/2013 PCP: Gildardo Griffes, MD  Assessment/Plan: 1. LLE DVT with mod to high probability of PE on VQ scan -continue anticoagulation with Heparin/coumadin  -Case manager consult for XArelto, cost/affordability -pulm consult appreciated  2. Corpulmonale /R heart failure -improving, 3.3L negative -continue diureses with IV lasix 40mg  q8, KCL -I/Os, weights -2 D ECHO 9/24 with normal EF 55-60%, grade 1 Diastolic dysfunction, mildly dilated RV and PA pressures , better than expected and improved from 2013 -ECHO last year with normal EF and severe Pulm HTN  3. DYspnea -multifactorial from IPF, suspected PE, and R heart failure  4. HTN -stable  5. H/o CAD -stable, continue ASA/Bystolic, statin  6. Leukocytosis -likely reactive, afebrile, improving  7. RLE blister/seeping fluid -wound RN consult appreciated, diurese  DVT proph:  On anticoagulation  Ambulate, PT eval  Code Status: FUll Family Communication:none at bedside Disposition Plan: Home when improved   Consultants:  Pulm pending  HPI/Subjective: Dyspnea starting to improve a little, no other complaints  Objective: Filed Vitals:   04/29/13 0612  BP: 116/63  Pulse: 78  Temp: 98.1 F (36.7 C)  Resp: 20    Intake/Output Summary (Last 24 hours) at 04/29/13 0843 Last data filed at 04/29/13 6578  Gross per 24 hour  Intake   1142 ml  Output   2050 ml  Net   -908 ml   Filed Weights   04/27/13 1531 04/28/13 0500 04/29/13 0612  Weight: 90.4 kg (199 lb 4.7 oz) 90.2 kg (198 lb 13.7 oz) 88.9 kg (195 lb 15.8 oz)    Exam:   General:  AAOx3, mild distress  Cardiovascular: S1S2/RRR  Respiratory: fine crackles throughout, worse at bases  Abdomen: soft, Nt, BS present  Musculoskeletal: 2plus edema, popped blister in RLE,   Data Reviewed: Basic Metabolic Panel:  Recent Labs Lab 04/27/13 1105 04/28/13 0642  04/29/13 0635  NA 144 146* 142  K 3.9 3.6 4.9  CL 98 100 97  CO2 39* 38* 39*  GLUCOSE 121* 119* 101*  BUN 48* 41* 41*  CREATININE 1.50* 1.30 1.36*  CALCIUM 10.2 8.9 9.4   Liver Function Tests:  Recent Labs Lab 04/27/13 1105 04/28/13 0642  AST 21 19  ALT 15 14  ALKPHOS 67 57  BILITOT 0.5 0.7  PROT 7.1 6.6  ALBUMIN 3.8 3.7   No results found for this basename: LIPASE, AMYLASE,  in the last 168 hours No results found for this basename: AMMONIA,  in the last 168 hours CBC:  Recent Labs Lab 04/27/13 1105 04/28/13 0642 04/29/13 0635  WBC 13.2* 10.6* 9.1  NEUTROABS 12.4*  --   --   HGB 11.9* 11.2* 11.5*  HCT 36.4* 34.8* 35.3*  MCV 94.5 93.3 93.1  PLT 190 191 199   Cardiac Enzymes:  Recent Labs Lab 04/27/13 1105  TROPONINI <0.30   BNP (last 3 results)  Recent Labs  04/27/13 1105  PROBNP 193.8*   CBG: No results found for this basename: GLUCAP,  in the last 168 hours  No results found for this or any previous visit (from the past 240 hour(s)).   Studies: Dg Abd 1 View  04/27/2013   *RADIOLOGY REPORT*  Clinical Data: Evaluate for free air  ABDOMEN - 1 VIEW  Comparison: Chest radiograph - earlier same day  Findings:  There are irregular loculated lucencies within the right upper abdominal quadrant as demonstrated on chest radiograph performed earlier same day.  Paucity of bowel  gas without definite evidence of obstruction.  No definite pneumatosis or portal venous gas.  Limited visualization of the lower thorax demonstrates coarse reticular opacities within the imaged left mid and lower lung.  Multilevel thoracolumbar spine degenerative change.  IMPRESSION: 1.  Indeterminate irregular loculated lucencies in the right upper abdominal quadrant are favored to represent either air within an interposed loop of bowel versus incomplete aeration of the right lung base.  If clinical concern persists for pneumoperitoneum, further evaluation with either the left lateral  decubitus abdominal radiographs or abdominal CT is recommended. 2.  Suspected advanced fibrotic change within the imaged left mid and lower lung.  This was made a call report.   Original Report Authenticated By: Tacey Ruiz, MD   Nm Pulmonary Perf And Vent  04/27/2013   CLINICAL DATA:  Shortness of breath.  EXAM: NUCLEAR MEDICINE VENTILATION - PERFUSION LUNG SCAN  TECHNIQUE: Ventilation images were obtained in multiple projections using inhaled aerosol technetium 99 M DTPA. Perfusion images were obtained in multiple projections after intravenous injection of Tc-26m MAA.  COMPARISON:  Plain film of the chest 04/27/2013 at 10:57 a.m.  RADIOPHARMACEUTICALS:  6 mCi Tc-61m DTPA aerosol and 40 mCi Tc-65m MAA  FINDINGS: Ventilation: Inhalation imaging is severely limited due to technical factors.  Perfusion: Wedge-shaped, subsegmental peripheral defects are seen in the right upper lobe and left upper lobe.  IMPRESSION: Limited study due to nondiagnostic inhalation imaging is intermediate to high probability for pulmonary embolus. Critical Value/emergent results were called by telephone at the time of interpretation on 04/27/2013 at 9:35 PMto Marissa, the patient's nurse , who verbally acknowledged these results.   Electronically Signed   By: Drusilla Kanner M.D.   On: 04/27/2013 21:36   US Venous Img Lower Bilateral  04/27/2013   *RADIOLOGY REPORT*  Clinical Data: Shortness of breath and left greater than right lower extremity swelling  BILATERAL LOWER EXTREMITY VENOUS DUPLEX ULTRASOUND  Technique:  Gray-scale sonography with graded compression, as well as color Doppler and duplex ultrasound, were performed to evaluate the deep venous system of both lower extremities from the level of the common femoral vein through the popliteal and proximal calf veins.  Spectral Doppler was utilized to evaluate flow at rest and with distal augmentation maneuvers.  Comparison:  None.  Findings:  Normal compressibility of bilateral  common femoral, superficial femoral, and popliteal veins is demonstrated.  No filling defects to suggest DVT on grayscale or color Doppler imaging.  Doppler waveforms show normal direction of venous flow, normal respiratory phasicity and response to augmentation. Bilateral great saphenous veins are patent and compressible.  However, the right peroneal veins are noncompressible and internal flow cannot be demonstrated concerning for focal calf DVT. The right posterior tibial veins are patent and compressible.  There is superficial edema in the left lower extremity limiting evaluation of the calf veins.  IMPRESSION:  1.  Incompressibility of the right peroneal vein concerning for focal calf DVT.  The posterior tibial veins, and popliteal and proximal veins remain patent.  2.  Edema in the superficial soft tissues limits evaluation of the left calf veins.  No evidence of left DVT to the popliteal level.  These results were called by telephone on 04/27/2013 at 12:40 p.m. to Dr. Laurey Arrow, who verbally acknowledged these results.   Original Report Authenticated By: Malachy Moan, M.D.   Dg Chest Portable 1 View  04/27/2013   CLINICAL DATA:  Shortness of breath. Bilateral lower extremity swelling. Low oxygen saturations. History of  hypertension, pulmonary fibrosis, coronary heart disease.  EXAM: PORTABLE CHEST - 1 VIEW  COMPARISON:  04/14/2007  FINDINGS: Shallow lung inflation. Heart size is difficult to evaluate because of significant pulmonary density. There has been progression of fibrotic change within the lung bases, left greater than right.  The right hemidiaphragm is elevated. There is question of free intraperitoneal air beneath the diaphragm. Alternatively, the findings could be related to an interposed loop of colon beneath the right hemidiaphragm. If there is also clinical concern regarding possible bowel perforation, further evaluation with left lateral decubitus view of the abdomen would be  suggested.  IMPRESSION: 1. Progression of fibrotic changes bilaterally, left greater than right. 2. Question of free intraperitoneal air beneath right hemidiaphragm versus interposed loop of colon. See above.  These results will be called to the ordering clinician or representative by the Radiologist Assistant, and communication documented in the PACS Dashboard.   Electronically Signed   By: Rosalie Gums M.D.   On: 04/27/2013 11:30   Dg Abd Decub  04/27/2013   CLINICAL DATA:  Question of free air. Shortness of breath. Leg swelling.  EXAM: ABDOMEN - 1 VIEW DECUBITUS  COMPARISON:  04/27/2013  FINDINGS: Bowel gas pattern is unremarkable. With the patient in left lateral decubitus position, there is no evidence for free intraperitoneal air. Coarse honeycomb changes are identified at the lung bases.  IMPRESSION: No evidence for free intraperitoneal air.   Electronically Signed   By: Rosalie Gums M.D.   On: 04/27/2013 13:39    Scheduled Meds: . furosemide  40 mg Intravenous Q8H  . potassium chloride  40 mEq Oral Daily  . predniSONE  20 mg Oral Daily  . sodium chloride  3 mL Intravenous Q12H  . Warfarin - Pharmacist Dosing Inpatient   Does not apply q1800   Continuous Infusions: . heparin Stopped (04/29/13 1610)    Principal Problem:   Acute pulmonary embolism Active Problems:   HYPERTENSION   PULMONARY FIBROSIS   Chronic respiratory failure with hypoxia   Acute-on-chronic respiratory failure   DVT, lower extremity   Leukocytosis   Cor pulmonale    Time spent:    [facility]  [facility] Pager [phone]. If 7PM-7AM, please contact night-coverage at [email or web address], password [facility] 04/29/2013, 8:43 AM  LOS: 2 days

## 2013-04-29 NOTE — Progress Notes (Signed)
Spoke with case management about insurance coverage for [employer]

## 2013-04-29 NOTE — Evaluation (Signed)
Physical Therapy Evaluation Patient Details Name: DEVYNN SCHEFF MRN: [id number] DOB: [birth year] Today's Date: 04/29/2013 Time: 4098-1191 PT Time Calculation (min): 30 min  PT Assessment / Plan / Recommendation History of Present Illness  With a hx of pulmonary fibrosis followed by Dr. Delford Field, HTN, HLD, and CAD who presented from his Pulmonologist's office with gradually worsening sob and LE edema x 3 weeks. The patient otherwise feels "great." In the ED, the patient was noted to have increased RLE edema which was positive for DVT.Marland Kitchen High probability PE  Clinical Impression  Very pleasant gentleman limited in mobility by cardiopulmonary status. Pt performing pursed lip breathing throughout and aware of some energy conservation techniques but discussed others such as using shower seat he currently has as well as rollator at DC. Pt with below deficits will benefit from acute therapy to maximize mobiltiy to return pt to PLOF and work at [employer].     PT Assessment  Patient needs continued PT services    Follow Up Recommendations  No PT follow up    Does the patient have the potential to tolerate intense rehabilitation      Barriers to Discharge Decreased caregiver support      Equipment Recommendations  Other (comment) (rollator)    Recommendations for Other Services     Frequency Min 3X/week    Precautions / Restrictions Precautions Precaution Comments: watch O2 sats Restrictions Weight Bearing Restrictions: No   Pertinent Vitals/Pain Soreness at RLE wound if touched sats 86-96% 4L at rest to 6L with gait with several standing rests to recover sats HR 101      Mobility  Bed Mobility Bed Mobility: Supine to Sit Supine to Sit: 6: Modified independent (Device/Increase time);HOB elevated;With rails Transfers Transfers: Sit to Stand;Stand to Sit Sit to Stand: 5: Supervision;From bed Stand to Sit: 5: Supervision;To chair/3-in-1;With armrests Details for Transfer Assistance:  cueing for hand placement with use of RW Ambulation/Gait Ambulation/Gait Assistance: 5: Supervision Ambulation Distance (Feet): 70 Feet Assistive device: Rolling walker Ambulation/Gait Assistance Details: cueing for posture and position in RW, 4 standing rest breaks throughout Gait Pattern: Step-through pattern;Decreased stride length Gait velocity: decreased     Exercises     PT Diagnosis: Difficulty walking  PT Problem List: Decreased activity tolerance;Decreased knowledge of use of DME;Cardiopulmonary status limiting activity PT Treatment Interventions: Gait training;Functional mobility training;Therapeutic activities;Stair training;DME instruction;Patient/family education     PT Goals(Current goals can be found in the care plan section) Acute Rehab PT Goals 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 Delford Field, HTN, HLD, and CAD who presented from his Pulmonologist's office with gradually worsening sob and LE edema x 3 weeks. The patient otherwise feels "great." In the ED, the patient was noted to have increased RLE edema which was positive for DVT.Marland Kitchen High probability PE       Prior Functioning  Home Living Family/patient expects to be discharged to:: Private residence Living Arrangements: Alone Type of Home: House Home Access: Stairs to enter [employer] of Steps: 2 Entrance Stairs-Rails: Right Home Layout: One level Home Equipment: Bedside commode;Tub bench Prior Function Level of Independence: Independent Communication Communication: No difficulties    Cognition  Cognition Arousal/Alertness: Awake/alert Behavior During Therapy: WFL for tasks assessed/performed Overall  Cognitive Status: Within Functional Limits for tasks assessed    Extremity/Trunk Assessment Upper Extremity Assessment Upper Extremity Assessment: Defer to OT evaluation Lower Extremity Assessment Lower Extremity Assessment: Generalized weakness Cervical / Trunk Assessment Cervical / Trunk Assessment: Normal   Balance    End of Session PT - End of Session Activity Tolerance: Patient limited by fatigue Patient left: in chair;with call bell/phone within reach Nurse Communication: Mobility status  GP     Toney Sang [facility] 04/29/2013, 12:34 PM  Delaney Meigs, PT [phone]

## 2013-04-29 NOTE — Progress Notes (Addendum)
ANTICOAGULATION CONSULT NOTE - Follow Up Consult  Pharmacy Consult for Coumadin Indication: pulmonary embolus and DVT  No Known Allergies  Patient Measurements: Weight: 195 lb 15.8 oz (88.9 kg)  Vital Signs: Temp: 98.1 F (36.7 C) (09/25 0612) Temp src: Oral (09/25 0612) BP: 116/63 mmHg (09/25 0612) Pulse Rate: 81 (09/25 1228)  Labs:  Recent Labs  04/27/13 1105 04/28/13 0016 04/28/13 0642 04/28/13 1425 04/29/13 0635  HGB 11.9*  --  11.2*  --  11.5*  HCT 36.4*  --  34.8*  --  35.3*  PLT 190  --  191  --  199  LABPROT 13.2  --   --  14.2 15.2  INR 1.02  --   --  1.13 1.23  HEPARINUNFRC  --  1.54*  --  1.22* 1.01*  CREATININE 1.50*  --  1.30  --  1.36*  TROPONINI <0.30  --   --   --   --     The CrCl is unknown because both a height and weight (above a minimum accepted value) are required for this calculation.   Medications:  Scheduled:  . furosemide  40 mg Intravenous Q8H  . potassium chloride  40 mEq Oral Daily  . predniSONE  20 mg Oral Daily  . sodium chloride  3 mL Intravenous Q12H  . Warfarin - Pharmacist Dosing Inpatient   Does not apply q1800   Infusions:  . heparin 900 Units/hr (04/29/13 4098)    Assessment: 67 yo M with new DVT and PE on day #3 of 5 day minimum overlap of Heparin >Coumadin therapy.  INR today is 1.23, slowly rising.  Heparin level was addressed this morning and rate was adjusted.  Follow-up level ordered for 1600 today.  Noted possible plans to switch to Xarelto depending on insurance coverage.   Goal of Therapy:  INR 2-3 Monitor platelets by anticoagulation protocol: Yes   Plan:  Coumadin 7.5 mg PO x 1 tonight. Continue daily INR, heparin level, and CBC. Follow-up with heparin level at 1600 today.  If changed to Xarelto, can start Xarelto 15mg  PO BID (with meals) at the same time as discontinuation of Heparin and Coumadin.  No overlap is needed.  [employer], Pharm.D., BCPS Clinical Pharmacist Pager [phone] 04/29/2013  1:27 PM   Addendum: Pharmacy consulted to transition heparin drip to Xarelto. Spoke with RN and heparin to be turned off prior to first Xarelto dose with evening meal.  Plan: -Discontinue heparin drip at 16:30 -Xarelto 15 mg PO bid with meals for 21 days then 20 mg daily with supper  [facility] [place].D., BCPS Clinical Pharmacist Pager: [phone] 04/29/2013 3:39 PM

## 2013-04-30 LAB — BASIC METABOLIC PANEL
Calcium: 9 mg/dL (ref 8.4–10.5)
Chloride: 99 mEq/L (ref 96–112)
GFR calc Af Amer: 53 mL/min — ABNORMAL LOW (ref >90)
GFR calc non Af Amer: 46 mL/min — ABNORMAL LOW (ref >90)
Glucose, Bld: 93 mg/dL (ref 70–99)
Potassium: 4.7 mEq/L (ref 3.5–5.1)
Sodium: 144 mEq/L (ref 135–145)

## 2013-04-30 LAB — CBC
Hemoglobin: 10.9 g/dL — ABNORMAL LOW (ref 13.0–17.0)
MCHC: 32.2 g/dL (ref 30.0–36.0)
Platelets: 186 10*3/uL (ref 150–400)
RDW: 14.6 % (ref 11.5–15.5)
WBC: 10.1 10*3/uL (ref 4.0–10.5)

## 2013-04-30 LAB — PROTIME-INR
INR: 2.9 — ABNORMAL HIGH (ref 0.00–1.49)
Prothrombin Time: 29.3 seconds — ABNORMAL HIGH (ref 11.6–15.2)

## 2013-04-30 MED ORDER — FUROSEMIDE 10 MG/ML IJ SOLN
40.0000 mg | Freq: Two times a day (BID) | INTRAMUSCULAR | Status: DC
  Administered 2013-04-30 (×2): 40 mg via INTRAVENOUS
  Filled 2013-04-30 (×2): qty 4

## 2013-04-30 MED ORDER — RIVAROXABAN 20 MG PO TABS: 20.0000 mg | ORAL_TABLET | Freq: Every day | ORAL | Status: DC

## 2013-04-30 NOTE — Progress Notes (Signed)
[facility] PROGRESS NOTE  Joshua Booth ZOX:[id number] DOB: 09-29-1945 DOA: 04/27/2013 PCP: Gildardo Griffes, MD  Assessment/Plan: 1. LLE DVT with mod to high probability of PE on VQ scan -transitioned from Heparin/coumadin to Xarelto yesterday -pulm consult appreciated  2. Corpulmonale /R heart failure -improving, 3.4L negative -slight bump in creatinine, cut down lasix to 40mg  q12 -I/Os, weights -2 D ECHO 9/24 with normal EF 55-60%, grade 1 Diastolic dysfunction, mildly dilated RV and PA pressures , better than expected and improved from 2013 -ECHO last year with normal EF and severe Pulm HTN  3. DYspnea -improving -multifactorial from IPF, suspected PE, and R heart failure  4. HTN -stable  5. H/o CAD -stable, continue ASA/Bystolic, statin  6. Leukocytosis -likely reactive, afebrile, improving  7. RLE blister/seeping fluid -wound RN consult appreciated, diurese  DVT proph:  On anticoagulation  Ambulate, PT eval noted  Code Status: FUll Family Communication:none at bedside Disposition Plan: Home in couple of days   Consultants:  Pulm pending  HPI/Subjective: Feeling better, dyspnea with activity improved  Objective: Filed Vitals:   04/30/13 0530  BP: 111/59  Pulse: 89  Temp: 97.8 F (36.6 C)  Resp: 18    Intake/Output Summary (Last 24 hours) at 04/30/13 0733 Last data filed at 04/30/13 8119  Gross per 24 hour  Intake   1440 ml  Output   1525 ml  Net    -85 ml   Filed Weights   04/28/13 0500 04/29/13 0612 04/30/13 0530  Weight: 90.2 kg (198 lb 13.7 oz) 88.9 kg (195 lb 15.8 oz) 89.2 kg (196 lb 10.4 oz)    Exam:   General:  AAOx3, mild distress  Cardiovascular: S1S2/RRR  Respiratory: fine crackles throughout, worse at bases  Abdomen: soft, Nt, BS present  Musculoskeletal: 2plus edema, popped blister in RLE,   Data Reviewed: Basic Metabolic Panel:  Recent Labs Lab 04/27/13 1105 04/28/13 0642 04/29/13 0635 04/30/13 0500   NA 144 146* 142 144  K 3.9 3.6 4.9 4.7  CL 98 100 97 99  CO2 39* 38* 39* 39*  GLUCOSE 121* 119* 101* 93  BUN 48* 41* 41* 44*  CREATININE 1.50* 1.30 1.36* 1.51*  CALCIUM 10.2 8.9 9.4 9.0   Liver Function Tests:  Recent Labs Lab 04/27/13 1105 04/28/13 0642  AST 21 19  ALT 15 14  ALKPHOS 67 57  BILITOT 0.5 0.7  PROT 7.1 6.6  ALBUMIN 3.8 3.7   No results found for this basename: LIPASE, AMYLASE,  in the last 168 hours No results found for this basename: AMMONIA,  in the last 168 hours CBC:  Recent Labs Lab 04/27/13 1105 04/28/13 0642 04/29/13 0635 04/30/13 0500  WBC 13.2* 10.6* 9.1 10.1  NEUTROABS 12.4*  --   --   --   HGB 11.9* 11.2* 11.5* 10.9*  HCT 36.4* 34.8* 35.3* 33.9*  MCV 94.5 93.3 93.1 92.9  PLT 190 191 199 186   Cardiac Enzymes:  Recent Labs Lab 04/27/13 1105  TROPONINI <0.30   BNP (last 3 results)  Recent Labs  04/27/13 1105  PROBNP 193.8*   CBG: No results found for this basename: GLUCAP,  in the last 168 hours  No results found for this or any previous visit (from the past 240 hour(s)).   Studies: No results found.  Scheduled Meds: . atorvastatin  40 mg Oral q1800  . furosemide  40 mg Intravenous Q12H  . nebivolol  10 mg Oral Daily  . potassium chloride  40 mEq Oral Daily  .  predniSONE  20 mg Oral Daily  . rivaroxaban  15 mg Oral BID WC  . sodium chloride  3 mL Intravenous Q12H   Continuous Infusions:    Principal Problem:   Acute pulmonary embolism Active Problems:   HYPERTENSION   PULMONARY FIBROSIS   Chronic respiratory failure with hypoxia   Acute-on-chronic respiratory failure   DVT, lower extremity   Leukocytosis   Cor pulmonale   Blister of right leg    Time spent:    [facility]  [facility] Pager [phone]. If 7PM-7AM, please contact night-coverage at [email or web address], password [facility] 04/30/2013, 7:33 AM  LOS: 3 days

## 2013-04-30 NOTE — Progress Notes (Signed)
PULMONARY  / CRITICAL CARE MEDICINE  Name: Joshua Booth MRN: [id number] DOB: 11/07/1945    ADMISSION DATE:  04/27/2013 CONSULTATION DATE: 04/28/2013  REFERRING MD :  Jomarie Longs  PRIMARY SERVICE:  [facility]  CHIEF COMPLAINT:  Pulmonary Emboli in setting IPF  BRIEF PATIENT DESCRIPTION: 67 y.o. male with a hx of pulmonary fibrosis diagnosed over 10 years via biopsy and  followed by Dr. Delford Field, HTN, HLD, and CAD who presented from his Pulmonologist's office with gradually worsening SOB and LE edema x 3 weeks on 04/27/2013.  SIGNIFICANT EVENTS / STUDIES:  9/23 NM Pulmonary Perf and Vent: high prob apical 9/23 US venous Img Lower Bilateral: RLE DVT  9/24 ECHO: Wall thickness was increased in a pattern of mild LVH. Systolic function was normal. The estimated ejection fraction was in the range of 55% to 60%. Wall motion was normal; there were no regional wall motion abnormalities. Definity echo contrast was used. Doppler parameters are consistent with abnormal left ventricular relaxation (grade 1 diastolic dysfunction).Pulmonary arteries: PA peak pressure: 47mm Hg (S).  LINES / TUBES:  CULTURES: None  ANTIBIOTICS: None    SUBJECTIVE:  Feels better   VITAL SIGNS: Temp:  [97.8 F (36.6 C)-98.2 F (36.8 C)] 97.8 F (36.6 C) (09/26 0530) Pulse Rate:  [68-89] 89 (09/26 0530) Resp:  [18] 18 (09/26 0530) BP: (106-111)/(55-59) 111/59 mmHg (09/26 0530) SpO2:  [98 %-99 %] 98 % (09/26 0530) Weight:  [89.2 kg (196 lb 10.4 oz)] 89.2 kg (196 lb 10.4 oz) (09/26 0530) 4 liters  PHYSICAL EXAMINATION  General:  AAOx3, mild distress, no focal deficits.  Cardiovascular: S1S2/RRR, soft early diastolic decresendo murmur heard at left sternal border. Respiratory: velcro crackles Abdomen:  Protuburent, soft, normoactive BS x4.   Skin: Pale, cool, lower extremity edema. Open blister present on right lower leg. Clean, dry dressing currently in place.    Recent Labs Lab 04/28/13 0642 04/29/13 0635  04/30/13 0500  NA 146* 142 144  K 3.6 4.9 4.7  CL 100 97 99  CO2 38* 39* 39*  BUN 41* 41* 44*  CREATININE 1.30 1.36* 1.51*  GLUCOSE 119* 101* 93    Recent Labs Lab 04/28/13 0642 04/29/13 0635 04/30/13 0500  HGB 11.2* 11.5* 10.9*  HCT 34.8* 35.3* 33.9*  WBC 10.6* 9.1 10.1  PLT 191 199 186   No results found.  ASSESSMENT / PLAN: Acute on chronic respiratory failure likely d/t decompensated Cor pulmonale in the setting of acute PE complicated by severe IPF, also concerning body habitus and possible sleep apnea.  Lab Results  Component Value Date   INR 2.90* 04/30/2013   INR 1.23 04/29/2013   INR 1.13 04/28/2013   Plan:  Changed to Xarelto  LE DVT Plan: See pulm section re: anticoagulation  Mild AKI: Recent Labs     04/28/13  0642  04/29/13  0635  04/30/13  0500  CREATININE  1.30  1.36*  1.51*  sl bump w/ diuresis Plan: Cont current lasix dosing, watch Cr while diuresing per primary team  Hypernatremia    Recent Labs Lab 04/28/13 0642 04/29/13 0635 04/30/13 0500  NA 146* 142 144  improved w/ po self-regulation Plan: F/u bmp  Mild anemia  Recent Labs Lab 04/28/13 0642 04/29/13 0635 04/30/13 0500  HGB 11.2* 11.5* 10.9*  plan: Trend cbc on anticoagulation  Wound/Blister RLE Needs wound care consult RLE blister    Caryl Bis  [phone]  Cell  [phone]  If no response or cell goes to voicemail, call  beeper [phone]  Pulmonary and Critical Care Medicine [facility] Pager: [phone]  04/30/2013, 5:24 PM

## 2013-05-01 LAB — BASIC METABOLIC PANEL
CO2: 37 mEq/L — ABNORMAL HIGH (ref 19–32)
Creatinine, Ser: 1.41 mg/dL — ABNORMAL HIGH (ref 0.50–1.35)
GFR calc Af Amer: 58 mL/min — ABNORMAL LOW (ref >90)
GFR calc non Af Amer: 50 mL/min — ABNORMAL LOW (ref >90)
Glucose, Bld: 86 mg/dL (ref 70–99)
Potassium: 4.5 mEq/L (ref 3.5–5.1)
Sodium: 145 mEq/L (ref 135–145)

## 2013-05-01 LAB — CBC
Hemoglobin: 10.9 g/dL — ABNORMAL LOW (ref 13.0–17.0)
MCHC: 33.2 g/dL (ref 30.0–36.0)
Platelets: 179 10*3/uL (ref 150–400)
RBC: 3.58 MIL/uL — ABNORMAL LOW (ref 4.22–5.81)

## 2013-05-01 MED ORDER — FUROSEMIDE 40 MG PO TABS
40.0000 mg | ORAL_TABLET | Freq: Two times a day (BID) | ORAL | Status: DC
  Administered 2013-05-01 – 2013-05-02 (×3): 40 mg via ORAL
  Filled 2013-05-01 (×5): qty 1

## 2013-05-01 NOTE — Progress Notes (Addendum)
Pt ambulated with assistance and front wheel walker 100 ft. Pt was on 6L of oxygen and oxygen saturation levels were 93 at rest and stayed above 88% while ambulating.  Pt tolerated well.  Orson Ape RN

## 2013-05-01 NOTE — Progress Notes (Signed)
[facility] PROGRESS NOTE  Joshua Booth JXB:[id number] DOB: 18-Apr-1946 DOA: 04/27/2013 PCP: Gildardo Griffes, MD  Assessment/Plan: 1. LLE DVT with mod to high probability of PE on VQ scan -transitioned from Heparin/coumadin to Xarelto  -pulm consult appreciated  2. Corpulmonale /R heart failure -improving, 5.6L negative -Change lasix to PO 40mg  q12 -I/Os, weights -2 D ECHO 9/24 with normal EF 55-60%, grade 1 Diastolic dysfunction, mildly dilated RV and PA pressures , better than expected and improved from 2013 -ECHO last year with normal EF and severe Pulm HTN  3. DYspnea -improving -multifactorial from IPF, suspected PE, and R heart failure  4. HTN -stable  5. H/o CAD -stable, continue ASA/Bystolic, statin  6. Leukocytosis -likely reactive, afebrile, improving  7. RLE blister/seeping fluid -wound RN consult appreciated, diurese  DVT proph:  On anticoagulation  Ambulate, PT eval noted  Code Status: FUll Family Communication:none at bedside Disposition Plan: Home tomorrow if stable   Consultants:  Pulm pending  HPI/Subjective: Feeling better, dyspnea with activity improved, eating well, walked down to nurses station yesterday   Objective: Filed Vitals:   05/01/13 0438  BP: 102/67  Pulse: 66  Temp: 98.3 F (36.8 C)  Resp: 18    Intake/Output Summary (Last 24 hours) at 05/01/13 0759 Last data filed at 05/01/13 0457  Gross per 24 hour  Intake    240 ml  Output   2200 ml  Net  -1960 ml   Filed Weights   04/28/13 0500 04/29/13 0612 04/30/13 0530  Weight: 90.2 kg (198 lb 13.7 oz) 88.9 kg (195 lb 15.8 oz) 89.2 kg (196 lb 10.4 oz)    Exam:   General:  AAOx3, mild distress  Cardiovascular: S1S2/RRR  Respiratory: fine crackles throughout, worse at bases  Abdomen: soft, Nt, BS present  Musculoskeletal: 1plus edema, popped blister in RLE with dressing   Data Reviewed: Basic Metabolic Panel:  Recent Labs Lab 04/27/13 1105  04/28/13 0642 04/29/13 0635 04/30/13 0500 05/01/13 0520  NA 144 146* 142 144 145  K 3.9 3.6 4.9 4.7 4.5  CL 98 100 97 99 100  CO2 39* 38* 39* 39* 37*  GLUCOSE 121* 119* 101* 93 86  BUN 48* 41* 41* 44* 45*  CREATININE 1.50* 1.30 1.36* 1.51* 1.41*  CALCIUM 10.2 8.9 9.4 9.0 8.8   Liver Function Tests:  Recent Labs Lab 04/27/13 1105 04/28/13 0642  AST 21 19  ALT 15 14  ALKPHOS 67 57  BILITOT 0.5 0.7  PROT 7.1 6.6  ALBUMIN 3.8 3.7   No results found for this basename: LIPASE, AMYLASE,  in the last 168 hours No results found for this basename: AMMONIA,  in the last 168 hours CBC:  Recent Labs Lab 04/27/13 1105 04/28/13 0642 04/29/13 0635 04/30/13 0500 05/01/13 0520  WBC 13.2* 10.6* 9.1 10.1 10.0  NEUTROABS 12.4*  --   --   --   --   HGB 11.9* 11.2* 11.5* 10.9* 10.9*  HCT 36.4* 34.8* 35.3* 33.9* 32.8*  MCV 94.5 93.3 93.1 92.9 91.6  PLT 190 191 199 186 179   Cardiac Enzymes:  Recent Labs Lab 04/27/13 1105  TROPONINI <0.30   BNP (last 3 results)  Recent Labs  04/27/13 1105  PROBNP 193.8*   CBG: No results found for this basename: GLUCAP,  in the last 168 hours  No results found for this or any previous visit (from the past 240 hour(s)).   Studies: No results found.  Scheduled Meds: . atorvastatin  40 mg Oral q1800  .  furosemide  40 mg Oral BID  . nebivolol  10 mg Oral Daily  . potassium chloride  40 mEq Oral Daily  . predniSONE  20 mg Oral Daily  . rivaroxaban  15 mg Oral BID WC  . [START ON 05/21/2013] rivaroxaban  20 mg Oral Q supper  . sodium chloride  3 mL Intravenous Q12H   Continuous Infusions:    Principal Problem:   Acute pulmonary embolism Active Problems:   HYPERTENSION   PULMONARY FIBROSIS   Chronic respiratory failure with hypoxia   Acute-on-chronic respiratory failure   DVT, lower extremity   Leukocytosis   Cor pulmonale   Blister of right leg    Time spent:    [facility]  [facility] Pager  [phone]. If 7PM-7AM, please contact night-coverage at [email or web address], password [facility] 05/01/2013, 7:59 AM  LOS: 4 days

## 2013-05-02 LAB — BASIC METABOLIC PANEL
BUN: 42 mg/dL — ABNORMAL HIGH (ref 6–23)
Chloride: 99 mEq/L (ref 96–112)
Creatinine, Ser: 1.44 mg/dL — ABNORMAL HIGH (ref 0.50–1.35)
GFR calc Af Amer: 56 mL/min — ABNORMAL LOW (ref >90)
GFR calc non Af Amer: 48 mL/min — ABNORMAL LOW (ref >90)
Potassium: 4.2 mEq/L (ref 3.5–5.1)
Sodium: 141 mEq/L (ref 135–145)

## 2013-05-02 MED ORDER — RIVAROXABAN 20 MG PO TABS: 20.0000 mg | ORAL_TABLET | Freq: Every day | ORAL | Status: DC

## 2013-05-02 MED ORDER — POTASSIUM CHLORIDE CRYS ER 20 MEQ PO TBCR
20.0000 meq | EXTENDED_RELEASE_TABLET | Freq: Once | ORAL | Status: AC
  Administered 2013-05-02: 20 meq via ORAL

## 2013-05-02 MED ORDER — FUROSEMIDE 20 MG PO TABS: 40.0000 mg | ORAL_TABLET | Freq: Two times a day (BID) | ORAL | Status: AC

## 2013-05-02 MED ORDER — RIVAROXABAN 15 MG PO TABS: 15.0000 mg | ORAL_TABLET | Freq: Two times a day (BID) | ORAL | Status: DC

## 2013-05-02 NOTE — Progress Notes (Addendum)
05/02/2013 11:44 AM Nursing note Discharge avs form, medications already taken today and those due this evening given and explained to patient. RX given and explained to patient. xarelto education provided. CHF education provided. Follow up appointments, when to call MD also reviewed. HH setup also reviewed. Questions and concerns addressed. D/c iv line. D/c tele. D/c home with family per orders.  Kendel Pesnell, Blanchard Kelch

## 2013-05-02 NOTE — Progress Notes (Addendum)
   CARE MANAGEMENT NOTE 05/02/2013  Patient:  Joshua Booth, Joshua Booth   Account Number:  [id number]  Date Initiated:  04/28/2013  Documentation initiated by:  Booth,Joshua  Subjective/Objective Assessment:   PT ADM ON 04/27/13 WITH PE/DVT.  PTA, PT LIVES ALONE AND IS ON HOME OXYGEN.     Action/Plan:   PT/OT CONSULTS PENDING.  WILL FOLLOW FOR DISCHARGE NEEDS AS PT PROGRESSES.   Anticipated DC Date:  05/02/2013   Anticipated DC Plan:  HOME W HOME HEALTH SERVICES      DC Planning Services  CM consult      Choice offered to / List presented to:     DME arranged  [facility]st      DME agency  [facility]     [facility] arranged  HH-1 RN      Status of service:  Completed, signed off Medicare Important Message given?   (If response is "NO", the following Medicare IM given date fields will be blank) Date Medicare IM given:   Date Additional Medicare IM given:    Discharge Disposition:  HOME W HOME HEALTH SERVICES  Per UR Regulation:  Reviewed for med. necessity/level of care/duration of stay  If discussed at Long Length of Stay Meetings, dates discussed:    Comments:  05/02/13 09:45 CM spoke with pt in room who states he is aware of the cost of Xarelto after his 30 day free trial card is used.  Pt states he has concentrater at home and gets his O2 from [employer] and states he will call them when he returns home as he has his O2 sent to his office and does not wish to interrupt this arrangement. Pt has O2 tank from home for ride home from hospital.   Choice offered for [facility] wound care.  Pt chose [facility].  Faxed referral to [facility].  No other CM needs were communicated. Joshua Booth, Joshua Booth [phone].    04/29/13 Joshua AMERSON,RN,BSN [phone] CM REFERRAL TO CHECK XARELTO COVERAGE.  PER TRENT AT [facility], (PT'S PHARMACY), PT WILL HAS $30.03 COPAY FOR 15MG  DOSE, AND $43.00 COPAY FOR 20MG  DOSE.  PT STATES HE CAN AFFORD THESE COPAYS.  NOTIFIED DR Joshua Booth OF THIS. PT WILL LIKELY  NEED HH FOLLOW UP AT DC; NEEDS ROLLATOR WALKER AS RECOMMENDED BY P.T.  WILL FOLLOW UP.  COMPLETED PREAUTHORIZATION FOR XARELTO THROUGH [facility].  PHONE [phone].

## 2013-05-02 NOTE — Progress Notes (Signed)
05/02/2013 0845 Confirmed with Dr. Jomarie Longs that pt. HH orders for RN were for wound check on RLE blister as well as for chronic pulmonary disease. CM updated on this information. Verbal orders received to keep current dressing in place at discharge until evaluation by [facility]. Orders enacted.  Nyisha Clippard, Blanchard Kelch

## 2013-05-04 ENCOUNTER — Telehealth: Payer: Self-pay | Admitting: *Deleted

## 2013-05-04 NOTE — Telephone Encounter (Signed)
Message copied by Gweneth Dimitri D on Tue May 04, 2013 11:03 AM ------      Message from: Shan Levans E      Created: Tue May 04, 2013  8:27 AM       Need an appt soon for Joshua Booth post hosp d/c in [place] office ------

## 2013-05-04 NOTE — Telephone Encounter (Signed)
Pt has a HFU already scheduled with Dr. Lovie Macadamia on May 10, 2013 at 4 pm in [place]. Called, spoke with pt.  He is aware of this appt and is to call back if anything is needed prior to appt.

## 2013-05-10 ENCOUNTER — Inpatient Hospital Stay (HOSPITAL_COMMUNITY)
Admission: EM | Admit: 2013-05-10 | Discharge: 2013-05-13 | DRG: 193 | Disposition: A | Discharge status: Home / Self Care | Payer: Medicare Other | Source: Other Acute Inpatient Hospital | Attending: Internal Medicine | Admitting: Internal Medicine

## 2013-05-10 ENCOUNTER — Encounter (HOSPITAL_COMMUNITY): Payer: Self-pay | Admitting: *Deleted

## 2013-05-10 ENCOUNTER — Inpatient Hospital Stay: Payer: Medicare Other | Admitting: Critical Care Medicine

## 2013-05-10 ENCOUNTER — Inpatient Hospital Stay (HOSPITAL_COMMUNITY): Payer: Medicare Other

## 2013-05-10 DIAGNOSIS — I2510 Atherosclerotic heart disease of native coronary artery without angina pectoris: Secondary | ICD-10-CM

## 2013-05-10 DIAGNOSIS — I2781 Cor pulmonale (chronic): Secondary | ICD-10-CM

## 2013-05-10 DIAGNOSIS — IMO0002 Reserved for concepts with insufficient information to code with codable children: Secondary | ICD-10-CM

## 2013-05-10 DIAGNOSIS — J9611 Chronic respiratory failure with hypoxia: Secondary | ICD-10-CM | POA: Diagnosis present

## 2013-05-10 DIAGNOSIS — I2699 Other pulmonary embolism without acute cor pulmonale: Secondary | ICD-10-CM

## 2013-05-10 DIAGNOSIS — I509 Heart failure, unspecified: Secondary | ICD-10-CM | POA: Diagnosis present

## 2013-05-10 DIAGNOSIS — S80821D Blister (nonthermal), right lower leg, subsequent encounter: Secondary | ICD-10-CM

## 2013-05-10 DIAGNOSIS — J9620 Acute and chronic respiratory failure, unspecified whether with hypoxia or hypercapnia: Secondary | ICD-10-CM

## 2013-05-10 DIAGNOSIS — A419 Sepsis, unspecified organism: Secondary | ICD-10-CM

## 2013-05-10 DIAGNOSIS — L97909 Non-pressure chronic ulcer of unspecified part of unspecified lower leg with unspecified severity: Secondary | ICD-10-CM | POA: Diagnosis present

## 2013-05-10 DIAGNOSIS — J189 Pneumonia, unspecified organism: Principal | ICD-10-CM | POA: Diagnosis present

## 2013-05-10 DIAGNOSIS — I872 Venous insufficiency (chronic) (peripheral): Secondary | ICD-10-CM | POA: Diagnosis present

## 2013-05-10 DIAGNOSIS — Z87891 Personal history of nicotine dependence: Secondary | ICD-10-CM

## 2013-05-10 DIAGNOSIS — I2789 Other specified pulmonary heart diseases: Secondary | ICD-10-CM | POA: Diagnosis present

## 2013-05-10 DIAGNOSIS — E785 Hyperlipidemia, unspecified: Secondary | ICD-10-CM | POA: Diagnosis present

## 2013-05-10 DIAGNOSIS — D899 Disorder involving the immune mechanism, unspecified: Secondary | ICD-10-CM | POA: Diagnosis present

## 2013-05-10 DIAGNOSIS — R7881 Bacteremia: Secondary | ICD-10-CM | POA: Diagnosis present

## 2013-05-10 DIAGNOSIS — Z7901 Long term (current) use of anticoagulants: Secondary | ICD-10-CM

## 2013-05-10 DIAGNOSIS — Z86711 Personal history of pulmonary embolism: Secondary | ICD-10-CM

## 2013-05-10 DIAGNOSIS — J9610 Chronic respiratory failure, unspecified whether with hypoxia or hypercapnia: Secondary | ICD-10-CM

## 2013-05-10 DIAGNOSIS — I10 Essential (primary) hypertension: Secondary | ICD-10-CM | POA: Diagnosis present

## 2013-05-10 DIAGNOSIS — G4730 Sleep apnea, unspecified: Secondary | ICD-10-CM | POA: Diagnosis present

## 2013-05-10 DIAGNOSIS — Z86718 Personal history of other venous thrombosis and embolism: Secondary | ICD-10-CM

## 2013-05-10 DIAGNOSIS — J8410 Pulmonary fibrosis, unspecified: Secondary | ICD-10-CM | POA: Diagnosis present

## 2013-05-10 DIAGNOSIS — R0902 Hypoxemia: Secondary | ICD-10-CM

## 2013-05-10 DIAGNOSIS — J84112 Idiopathic pulmonary fibrosis: Secondary | ICD-10-CM

## 2013-05-10 DIAGNOSIS — Z79899 Other long term (current) drug therapy: Secondary | ICD-10-CM

## 2013-05-10 DIAGNOSIS — D72829 Elevated white blood cell count, unspecified: Secondary | ICD-10-CM

## 2013-05-10 DIAGNOSIS — R945 Abnormal results of liver function studies: Secondary | ICD-10-CM

## 2013-05-10 HISTORY — DX: Heart failure, unspecified: I50.9

## 2013-05-10 HISTORY — DX: Shortness of breath: R06.02

## 2013-05-10 LAB — BASIC METABOLIC PANEL
Calcium: 9.1 mg/dL (ref 8.4–10.5)
GFR calc Af Amer: 63 mL/min — ABNORMAL LOW (ref >90)
GFR calc non Af Amer: 54 mL/min — ABNORMAL LOW (ref >90)
Sodium: 141 mEq/L (ref 135–145)

## 2013-05-10 LAB — CBC WITH DIFFERENTIAL/PLATELET
Basophils Absolute: 0.1 10*3/uL (ref 0.0–0.1)
Basophils Relative: 0 % (ref 0–1)
Eosinophils Absolute: 0 10*3/uL (ref 0.0–0.7)
Eosinophils Relative: 0 % (ref 0–5)
Lymphocytes Relative: 3 % — ABNORMAL LOW (ref 12–46)
MCHC: 33.2 g/dL (ref 30.0–36.0)
MCV: 92.6 fL (ref 78.0–100.0)
Monocytes Absolute: 0.8 10*3/uL (ref 0.1–1.0)
Platelets: 208 10*3/uL (ref 150–400)
RBC: 3.64 MIL/uL — ABNORMAL LOW (ref 4.22–5.81)
RDW: 14.7 % (ref 11.5–15.5)
WBC: 24.2 10*3/uL — ABNORMAL HIGH (ref 4.0–10.5)

## 2013-05-10 LAB — URINALYSIS, ROUTINE W REFLEX MICROSCOPIC
Bilirubin Urine: NEGATIVE
Glucose, UA: NEGATIVE mg/dL
Hgb urine dipstick: NEGATIVE
Ketones, ur: NEGATIVE mg/dL
Protein, ur: NEGATIVE mg/dL
Specific Gravity, Urine: 1.021 (ref 1.005–1.030)
pH: 5.5 (ref 5.0–8.0)

## 2013-05-10 LAB — EXPECTORATED SPUTUM ASSESSMENT W GRAM STAIN, RFLX TO RESP C

## 2013-05-10 LAB — EXPECTORATED SPUTUM ASSESSMENT W REFEX TO RESP CULTURE

## 2013-05-10 LAB — GLUCOSE, CAPILLARY: Glucose-Capillary: 134 mg/dL — ABNORMAL HIGH (ref 70–99)

## 2013-05-10 MED ORDER — ATORVASTATIN CALCIUM 40 MG PO TABS
40.0000 mg | ORAL_TABLET | Freq: Every day | ORAL | Status: DC
  Administered 2013-05-10 – 2013-05-12 (×3): 40 mg via ORAL
  Filled 2013-05-10 (×4): qty 1

## 2013-05-10 MED ORDER — ADULT MULTIVITAMIN W/MINERALS CH
1.0000 | ORAL_TABLET | Freq: Every day | ORAL | Status: DC
  Administered 2013-05-10 – 2013-05-13 (×4): 1 via ORAL
  Filled 2013-05-10 (×4): qty 1

## 2013-05-10 MED ORDER — VANCOMYCIN HCL IN DEXTROSE 750-5 MG/150ML-% IV SOLN
750.0000 mg | Freq: Two times a day (BID) | INTRAVENOUS | Status: DC
  Administered 2013-05-11 – 2013-05-13 (×6): 750 mg via INTRAVENOUS
  Filled 2013-05-10 (×7): qty 150

## 2013-05-10 MED ORDER — VANCOMYCIN HCL 10 G IV SOLR
1250.0000 mg | Freq: Once | INTRAVENOUS | Status: AC
  Administered 2013-05-10: 1250 mg via INTRAVENOUS
  Filled 2013-05-10: qty 1250

## 2013-05-10 MED ORDER — RIVAROXABAN 20 MG PO TABS: 20.0000 mg | ORAL_TABLET | Freq: Every day | ORAL | Status: DC

## 2013-05-10 MED ORDER — NEBIVOLOL HCL 10 MG PO TABS
10.0000 mg | ORAL_TABLET | Freq: Every day | ORAL | Status: DC
  Administered 2013-05-10 – 2013-05-13 (×4): 10 mg via ORAL
  Filled 2013-05-10 (×4): qty 1

## 2013-05-10 MED ORDER — SODIUM CHLORIDE 0.9 % IV SOLN: 250.0000 mL | INTRAVENOUS | Status: DC | PRN

## 2013-05-10 MED ORDER — PREDNISONE 20 MG PO TABS
20.0000 mg | ORAL_TABLET | Freq: Every day | ORAL | Status: DC
  Administered 2013-05-10 – 2013-05-13 (×4): 20 mg via ORAL
  Filled 2013-05-10 (×5): qty 1

## 2013-05-10 MED ORDER — PREDNISONE 20 MG PO TABS
20.0000 mg | ORAL_TABLET | Freq: Every day | ORAL | Status: DC
  Filled 2013-05-10 (×2): qty 1

## 2013-05-10 MED ORDER — VANCOMYCIN HCL 500 MG IV SOLR: 500.0000 mg | Freq: Two times a day (BID) | INTRAVENOUS | Status: DC

## 2013-05-10 MED ORDER — ONE-DAILY MULTI VITAMINS PO TABS: 1.0000 | ORAL_TABLET | Freq: Every day | ORAL | Status: DC

## 2013-05-10 MED ORDER — RIVAROXABAN 15 MG PO TABS
15.0000 mg | ORAL_TABLET | Freq: Two times a day (BID) | ORAL | Status: DC
  Administered 2013-05-10 – 2013-05-13 (×6): 15 mg via ORAL
  Filled 2013-05-10 (×8): qty 1

## 2013-05-10 MED ORDER — DEXTROSE 5 % IV SOLN
2.0000 g | Freq: Three times a day (TID) | INTRAVENOUS | Status: DC
  Administered 2013-05-11 – 2013-05-13 (×9): 2 g via INTRAVENOUS
  Filled 2013-05-10 (×11): qty 2

## 2013-05-10 MED ORDER — SODIUM CHLORIDE 0.9 % IV SOLN: INTRAVENOUS | Status: DC

## 2013-05-10 MED ORDER — FUROSEMIDE 40 MG PO TABS
40.0000 mg | ORAL_TABLET | Freq: Two times a day (BID) | ORAL | Status: DC
  Administered 2013-05-10 – 2013-05-13 (×6): 40 mg via ORAL
  Filled 2013-05-10 (×8): qty 1

## 2013-05-10 MED ORDER — DEXTROSE 5 % IV SOLN
2.0000 g | Freq: Once | INTRAVENOUS | Status: AC
  Administered 2013-05-10: 2 g via INTRAVENOUS
  Filled 2013-05-10: qty 2

## 2013-05-10 MED ORDER — ASPIRIN 300 MG RE SUPP
300.0000 mg | RECTAL | Status: AC
  Filled 2013-05-10: qty 1

## 2013-05-10 MED ORDER — POTASSIUM CHLORIDE CRYS ER 10 MEQ PO TBCR
10.0000 meq | EXTENDED_RELEASE_TABLET | Freq: Two times a day (BID) | ORAL | Status: DC
Start: 2013-05-10 — End: 2013-05-13
  Administered 2013-05-10 – 2013-05-13 (×6): 10 meq via ORAL
  Filled 2013-05-10 (×8): qty 1

## 2013-05-10 MED ORDER — AZATHIOPRINE 50 MG PO TABS
50.0000 mg | ORAL_TABLET | Freq: Every day | ORAL | Status: DC
  Administered 2013-05-10 – 2013-05-13 (×4): 50 mg via ORAL
  Filled 2013-05-10 (×4): qty 1

## 2013-05-10 MED ORDER — DEXTROSE 5 % IV SOLN: 1.0000 g | Freq: Two times a day (BID) | INTRAVENOUS | Status: DC

## 2013-05-10 MED ORDER — ASPIRIN 81 MG PO CHEW
324.0000 mg | CHEWABLE_TABLET | ORAL | Status: AC
  Administered 2013-05-10: 324 mg via ORAL
  Filled 2013-05-10: qty 4

## 2013-05-10 NOTE — H&P (Signed)
PULMONARY  / CRITICAL CARE MEDICINE  Name: Joshua Booth MRN: [id number] DOB: 04-12-1946    ADMISSION DATE:  05/10/2013 CONSULTATION DATE:  05/10/2013  REFERRING MD :  [facility] PRIMARY SERVICE: PCCM  CHIEF COMPLAINT:  Shortness of breath, low back pain   BRIEF PATIENT DESCRIPTION: 67 y/o male with PMH of HTN, HLD, CAD, and Pulmonary fibrosis presents to [facility] with increased sob and concering infiltrate. Pt recently hospitalized 9/23 with LE edema and increased sob found to have PE.  Dc'd on Xarelto.   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 Pt recently hospitalized 9/23-9/28 with LE edema and increased sob found to have PE. He was found to have right peroneal veins DVT. CXR was unremarkable and initial cardiac enzymes negative. Perfusion lung scanned completed 9/23 revealed wedge-shaped, subsegmental peripheral defects in the right upper lobe and left upper lobe garnering high suspicion for PE. Patient was  placed on LMWH and coumadin (9/23). Subsequently transitioned to Xarelto since discharge.   On arrival, patient placed on Bipap. Denies fever, urinary symptoms, changes in BM, chest pain, n/v/d/.  Complains of low back pain.  Reports he has been faithfully taking xarelto.  Plan was for 15 mg BID until 10/17 with transition to 20 mg Qd on 10/18.  PCCM consulted for possible need for  intubation.   Hard to obtain from patient due to resp distress on bipap. Most obtained from recent hospitalization   PAST MEDICAL HISTORY :  Past Medical History  Diagnosis Date  . Hyperlipemia   . Hypertension   . Pulmonary fibrosis   . Coronary heart disease    Past Surgical History  Procedure Laterality Date  . Cardiac catheterization  1988, 2000, 2011  . Cataract extraction  01-08-12   Prior to Admission medications   Medication Sig Start Date End Date Taking? Authorizing Provider  Acetylcysteine (NAC 600) 600 MG CAPS Take by mouth. 2 in am and 1 at bedtime     Historical Provider, MD  atorvastatin (LIPITOR) 40 MG tablet Take 40 mg by mouth at bedtime.      Historical Provider, MD  azaTHIOprine (IMURAN) 50 MG tablet Take 50 mg by mouth daily.    Historical Provider, MD  B Complex-C-Folic Acid (FOLBEE PLUS PO) Take by mouth daily.      Historical Provider, MD  BYSTOLIC 10 MG tablet Take 10 mg by mouth Daily. 07/22/11   Historical Provider, MD  furosemide (LASIX) 20 MG tablet Take 2 tablets (40 mg total) by mouth 2 (two) times daily. 05/02/13   Zannie Cove, MD  glucosamine-chondroitin 500-400 MG tablet Take 2 tablets by mouth daily.      Historical Provider, MD  KLOR-CON M20 20 MEQ tablet Take 10 mEq by mouth 2 (two) times daily.  01/02/12   Historical Provider, MD  Multiple Vitamin (MULTIVITAMIN) tablet Take 1 tablet by mouth daily.      Historical Provider, MD  NITROSTAT 0.4 MG SL tablet Place 0.4 mg  under the tongue every 5 (five) minutes as needed for chest pain.  09/20/11   Historical Provider, MD  predniSONE (DELTASONE) 10 MG tablet Take 20 mg by mouth daily.    Historical Provider, MD  Rivaroxaban (XARELTO) 15 MG TABS tablet Take 1 tablet (15 mg total) by mouth 2 (two) times daily with a meal. For 3 weeks, till 10/16 05/02/13   Zannie Cove, MD  Rivaroxaban (XARELTO) 20 MG TABS tablet Take 1 tablet (20 mg total) by mouth daily with supper. Starting 10/17 05/21/13   Zannie Cove, MD   No Known Allergies  FAMILY HISTORY:  No family history on file. SOCIAL HISTORY:  reports that he quit smoking about 29 years ago. He does not have any smokeless tobacco history on file. He reports that he does not drink alcohol or use illicit drugs.  REVIEW OF SYSTEMS: reviewed and pertinent findings indicated in HPI otherwise negative.   SUBJECTIVE: RT reports pt comfortable on bipap  VITAL SIGNS: Temp:  [98.2 F (36.8 C)] 98.2 F (36.8 C) (10/06 1325) Pulse Rate:  [92] 92 (10/06 1325) Resp:  [31] 31 (10/06 1325) BP: (117)/(72) 117/72 mmHg (10/06 1325) SpO2:  [100 %] 100 % (10/06 1325) FiO2 (%):  [40 %] 40 % (10/06 1325)  HEMODYNAMICS:    VENTILATOR SETTINGS: Vent Mode:  [-] BIPAP FiO2 (%):  [40 %] 40 % Set Rate:  [8 bmp] 8 bmp  INTAKE / OUTPUT: Intake/Output   None     PHYSICAL EXAMINATION: General: WDWN male, on bipap, mild distress Neuro:  A& O x3, follows commands appropriately HEENT:  NCAT, PEERL Cardiovascular:  RRR, no m/r/g  Lungs:  Bilateral diffuse wheezing, tachypnic  Abdomen: soft, + BS, mild tenderness on RLQ Musculoskeletal:  MAEs, 1+ edema bilaterally Skin:  Warm and dry, blistering on RLE   ASSESSMENT / PLAN:  PULMONARY A: Acute respiratory distress -  given recent hospitalization concern for HCAP? Vs PE with cor pulmonale IPF / UIP - patient on 5L home oxygen, 20 mg QD pred, Imruan Hx PE 9/23 Likely sleep apnea  P:   - abx as above - cxr now - Maintain SpO2> 92% - maintain on Bipap, cycle on 4 hours / off 1 for comfort  - consider sleep study for OSA/OHS as outpatient - continue current pred dosing - continue imuran - continue xarelto for PE -Will need nosocomial ABX  -may need Ct chest evaluation -neg  Balance goal, await chemistry  CARDIOVASCULAR A:  CHF - concern for acute exacerbation CAD Hypertension Hyperlipidemia P:  - ekg - continue ASA/ atorvastatin/Bystolic  - Continue Lasix  RENAL A:   No acute  issues P:   - bmet awaited  GASTROINTESTINAL A:  No acute issues P:   - NPO x sips with meds - likely can advance diet in PM or am pending resp status  HEMATOLOGIC A:   PE/DVT prophylaxis - recent PE 9/23 P:  - continue Xarelto - consider heparin gtt if further procedures needed - assess cbc -may need to study extension DVt?  INFECTIOUS A:   SOB - concern for HCAP rt base R LE blister/wound P:   - abx as above - ceftaz, vanc ( recent dc) - monitor fever curve and CBC - wound care - follow cultures as above  ENDOCRINE A:   No acute issues P:   - monitor CBGs  NEUROLOGIC A:   No acute issues P:   - monitor off benzo  Nathanial Rancher PA- Student Sherrie Sport  Lawernce Keas, NP-C [facility] Pgr: [phone] or [phone]  I have personally obtained a history, examined the patient, evaluated laboratory and imaging results, formulated the assessment and plan and placed orders.  CRITICAL CARE: The patient is critically ill with multiple organ systems failure and requires high complexity decision making for assessment and support, frequent evaluation and titration of therapies, application of advanced monitoring technologies and extensive interpretation of multiple databases. Critical Care Time devoted to patient care services described in this note is 40 minutes.   05/10/2013, 2:05 PM   Mcarthur Rossetti. Tyson Alias, MD, FACP Pgr: [phone] [facility]

## 2013-05-10 NOTE — Progress Notes (Signed)
Pt has grapefruit size red area (old blister) on right lower leg, present on admission

## 2013-05-10 NOTE — Progress Notes (Addendum)
ANTIBIOTIC CONSULT NOTE - INITIAL  Pharmacy Consult for Vancomycin, Elita Quick Indication: rule out pneumonia   No Known Allergies  Patient Measurements: Height: 5\' 10"  (177.8 cm) Weight: 198 lb (89.812 kg) IBW/kg (Calculated) : 73 Vital Signs: Temp: 98.2 F (36.8 C) (10/06 1415) Temp src: Axillary (10/06 1415) BP: 126/66 mmHg (10/06 1500) Pulse Rate: 91 (10/06 1518) Labs: No results found for this basename: WBC, HGB, PLT, LABCREA, CREATININE,  in the last 72 hours Estimated Creatinine Clearance: 55.3 ml/min (by C-G formula based on Cr of 1.44). -- based on SCr from 05/01/13.   Microbiology: No results found for this or any previous visit (from the past 720 hour(s)).  Medical History: Past Medical History  Diagnosis Date  . Hyperlipemia   . Hypertension   . Pulmonary fibrosis   . Coronary heart disease   . Shortness of breath   . CHF (congestive heart failure)    Assessment: 35 YOM with PMH significant for HTN, HLD, CAD, Pulmonary Fibrosis (requires 5L home oxygen) and hx PE (9/23) admitted from [facility] with increased shortness of breath. Note that patient has had recent admission on 9/23 for LE edema and found to be positive for PE. Patient was discharged home on Xarelto.   Vitals: Afebrile, HR 91, RR 27 Labs pending.   Goal of Therapy:  Vancomycin trough level 15-20 mcg/ml  Plan:  1. Vancomycin 1250mg  IV now. 2. Elita Quick 2g IV now. Then follow-up pending labs for appropriate assessment of current renal function to determine further dosing.   Link Snuffer, PharmD, BCPS Clinical Pharmacist [phone] 05/10/2013,3:35 PM   5:36 PM 10/6- SCr 1.31 , est CrCl ~ 60 ml/min; WBC 24.2 Blood and sputum cultures obtained. CXR- persistent nodular opacity in RLL. Patient immunocompromised on triple therapy (NAC + Imuran + Prednisone) for idiopathic pulmonary fibrosis.   Plan: Schedule vancomycin 750mg  IV every 12 hours. Schedule ceftazidime 2g IV every 8 hours.  Monitor renal  function and adjust doses as needed.  Check vancomycin trough at [facility] if continue therapy.

## 2013-05-11 ENCOUNTER — Inpatient Hospital Stay (HOSPITAL_COMMUNITY): Payer: Medicare Other

## 2013-05-11 DIAGNOSIS — R7881 Bacteremia: Secondary | ICD-10-CM

## 2013-05-11 DIAGNOSIS — B9689 Other specified bacterial agents as the cause of diseases classified elsewhere: Secondary | ICD-10-CM

## 2013-05-11 DIAGNOSIS — Z5189 Encounter for other specified aftercare: Secondary | ICD-10-CM

## 2013-05-11 DIAGNOSIS — A419 Sepsis, unspecified organism: Secondary | ICD-10-CM | POA: Insufficient documentation

## 2013-05-11 DIAGNOSIS — J84112 Idiopathic pulmonary fibrosis: Secondary | ICD-10-CM

## 2013-05-11 LAB — BASIC METABOLIC PANEL
BUN: 32 mg/dL — ABNORMAL HIGH (ref 6–23)
CO2: 35 mEq/L — ABNORMAL HIGH (ref 19–32)
Calcium: 9 mg/dL (ref 8.4–10.5)
Chloride: 101 mEq/L (ref 96–112)
GFR calc non Af Amer: 53 mL/min — ABNORMAL LOW (ref >90)
Glucose, Bld: 104 mg/dL — ABNORMAL HIGH (ref 70–99)
Potassium: 4.4 mEq/L (ref 3.5–5.1)
Sodium: 144 mEq/L (ref 135–145)

## 2013-05-11 LAB — GLUCOSE, CAPILLARY
Glucose-Capillary: 115 mg/dL — ABNORMAL HIGH (ref 70–99)
Glucose-Capillary: 92 mg/dL (ref 70–99)
Glucose-Capillary: 99 mg/dL (ref 70–99)

## 2013-05-11 LAB — CBC
HCT: 32.2 % — ABNORMAL LOW (ref 39.0–52.0)
Hemoglobin: 10.6 g/dL — ABNORMAL LOW (ref 13.0–17.0)
MCH: 30.5 pg (ref 26.0–34.0)
Platelets: 203 10*3/uL (ref 150–400)
RBC: 3.47 MIL/uL — ABNORMAL LOW (ref 4.22–5.81)

## 2013-05-11 LAB — PROCALCITONIN: Procalcitonin: 4.73 ng/mL

## 2013-05-11 LAB — TROPONIN I: Troponin I: <0.3 ng/mL (ref <0.30)

## 2013-05-11 NOTE — Progress Notes (Signed)
PULMONARY  / CRITICAL CARE MEDICINE  Name: Joshua Booth MRN: [id number] DOB: Jul 05, 1946    ADMISSION DATE:  05/10/2013 CONSULTATION DATE:  05/10/2013  REFERRING MD :  [facility] PRIMARY SERVICE: PCCM  CHIEF COMPLAINT:  Shortness of breath, low back pain   BRIEF PATIENT DESCRIPTION:   67 y/o male with PMH of HTN, HLD, CAD s/p Cath 2011, and IPF/UIP (TBBx via bronch 2008: non diagnostic, ANA, RF - negative 2008, CT Chestg 2011 - definitive UIP pattern. On immuiran/pred. Per hx too advanced for Pirfenidone EAP program. Baseline 4L at rest, 6L with exertion)    Pt recently hospitalized 9/23-9/28 with LE edema and increased sob found to have PE (based on intermed-high prob VQ RUL/LUL + right peroneal veins DVT) . DC'ed on  Xarelto (15 mg BID until 10/17 with transition to 20 mg Qd on 10/18.)   On 05/10/2013 went to [facility] with acute onset of low back pain nos and chills but no change in dyspnea. In ER, due to patient movement dyspnea got worse placed on bipap and admitted to ICU on bipap   LINES / TUBES: PIV  CULTURES: Sputum 10/6>>> BCx2 10/6>>> GNR per RN and labs     ANTIBIOTICS: Ceftaz 10/6 >>> Vanc 10/6>>>     SIGNIFICANT EVENTS / STUDIES:  9/23 -9/28 - Patient admitted by [facility] found to have PE. Discharged on xarelto 9/24 Echo showing EF 55-60%, mild pulmonary HTN, grade 1 diastolic dysfunction ....................................................................................................................................Marland Kitchen 10/6 - Admitted from [facility] with worsening SOB. RT reports pt comfortable on bipap    SUBJECTIVE/OVERNIGHT/INTERVAL HX  10.7/14:  Growing GNR. Dyspnea back to baseline. OFf bipap  VITAL SIGNS: Temp:  [97.6 F (36.4 C)-99.3 F (37.4 C)] 97.7 F (36.5 C) (10/07 0741) Pulse Rate:  [58-92] 59 (10/07 1000) Resp:  [17-32] 22 (10/07 1000) BP: (94-133)/(50-72) 110/55 mmHg (10/07 1000) SpO2:  [92 %-100 %] 98 % (10/07 1000) FiO2 (%):  [40  %] 40 % (10/06 1415) Weight:  [88.3 kg (194 lb 10.7 oz)-89.812 kg (198 lb)] 88.3 kg (194 lb 10.7 oz) (10/07 0500)  HEMODYNAMICS:    VENTILATOR SETTINGS: Vent Mode:  [-] BIPAP FiO2 (%):  [40 %] 40 % Set Rate:  [8 bmp] 8 bmp  INTAKE / OUTPUT: Intake/Output     10/06 0701 - 10/07 0700 10/07 0701 - 10/08 0700   P.O. 90    I.V. (mL/kg) 320 (3.6) 40 (0.5)   IV Piggyback 500 50   Total Intake(mL/kg) 910 (10.3) 90 (1)   Urine (mL/kg/hr) 900 300 (1)   Total Output 900 300   Net +10 -210          PHYSICAL EXAMINATION: General: WDWN male, off bipap,  Neuro:  A& O x3, follows commands appropriately HEENT:  NCAT, PEERL Cardiovascular:  RRR, no m/r/g  Lungs: RR 30s, small volumes, difficulty with sentences, bilateral extensive LL crackl;es - all appear baseline Abdomen: soft, + BS,  ? Left flank tenderness Musculoskeletal:  MAEs, 1+ edema bilaterally Skin:  Warm and dry, blistering on RLE   PULMONARY No results found for this basename: PHART, PCO2, PCO2ART, PO2, PO2ART, HCO3, TCO2, O2SAT,  in the last 168 hours  CBC  Recent Labs Lab 05/10/13 1629 05/11/13 0530  HGB 11.2* 10.6*  HCT 33.7* 32.2*  WBC 24.2* 20.1*  PLT 208 203    COAGULATION No results found for this basename: INR,  in the last 168 hours  CARDIAC  No results found for this basename:  TROPONINI,  in the last 168 hours No results found for this basename: PROBNP,  in the last 168 hours   CHEMISTRY  Recent Labs Lab 05/10/13 1629 05/11/13 0530  NA 141 144  K 4.1 4.4  CL 99 101  CO2 34* 35*  GLUCOSE 152* 104*  BUN 27* 32*  CREATININE 1.31 1.33  CALCIUM 9.1 9.0   Estimated Creatinine Clearance: 59.5 ml/min (by C-G formula based on Cr of 1.33).   LIVER No results found for this basename: AST, ALT, ALKPHOS, BILITOT, PROT, ALBUMIN, INR,  in the last 168 hours   INFECTIOUS No results found for this basename: LATICACIDVEN, PROCALCITON,  in the last 168 hours   ENDOCRINE CBG (last 3)   Recent  Labs  05/10/13 1739 05/11/13 0016 05/11/13 0737  GLUCAP 134* 115* 92         IMAGING x48h  Dg Chest Port 1 View  05/11/2013   CLINICAL DATA:  Shortness of breath, airspace disease, history hypertension, coronary disease, CHF, pulmonary fibrosis  EXAM: PORTABLE CHEST - 1 VIEW  COMPARISON:  Portable exam 0603 hr compared to 05/10/2013  FINDINGS: Enlargement of cardiac silhouette.  Calcified tortuous aorta.  Diffuse interstitial infiltrates at the mid to lower lungs bilaterally greater on the left compatible with history of pulmonary fibrosis.  Area of more focal opacity at the right lung base is perhaps minimally smaller.  Persistent elevation of right diaphragm.  No gross pleural effusion or pneumothorax.  IMPRESSION: Enlargement of cardiac silhouette.  Bibasilar pulmonary fibrosis.  Persistent superimposed opacity at the lower right lung, perhaps slightly smaller versus previous exam, consistent with pneumonia.   Electronically Signed   By: Ulyses Southward M.D.   On: 05/11/2013 08:05   Dg Chest Port 1 View  05/10/2013   CLINICAL DATA:  Acute respiratory failure  EXAM: PORTABLE CHEST - 1 VIEW  COMPARISON:  05/10/2013  FINDINGS: Cardiomediastinal silhouette is stable. Persistent nodular opacity right lower lobe. Stable scarring and fibrotic changes left lower lobe. No pulmonary edema.  IMPRESSION: Persistent nodular opacity right lower lobe. Stable scarring in left lower lobe.   Electronically Signed   By: Natasha Mead M.D.   On: 05/10/2013 16:13    \  ASSESSMENT / PLAN:  PULMONARY A: Baseline  - IPF / UIP :s evere, on immuran/pred  - Hx PE 9/23  - Likely sleep apnea   On 05/10/13: resp distress at admit related to GNR bacteremia and patient movement On 05/11/13: baseline  P:   - bipap prn - reasssess IPF with CT - but he refused CT due to discomfort - bipap prn for comfprt - consider sleep study for OSA/OHS as outpatient - continue current pred dosing - continue imuran - continue  xarelto for PE -Will need nosocomial ABX  - consider morphine for dyspnea  CARDIOVASCULAR A:  CHF - concern for acute exacerbation CAD Hypertension Hyperlipidemia P:  - cycle enzymes - check bnp and adjust lasix -- continue ASA/ atorvastatin/Bystolic  - Continue Lasix  RENAL A:   No acute issues P:   - bmet awaited  GASTROINTESTINAL A:  No acute issues P:   - NPO x sips with meds - likely can advance diet in PM or am pending resp status  HEMATOLOGIC A:   PE/DVT prophylaxis - recent PE 9/23 P:  - continue Xarelto - consider heparin gtt if further procedures needed  INFECTIOUS A:   SOB - concern for HCAP rt base R LE blister/wound AT risk for MDR and  HCAP and PCP  due to immuran/pred  05/11/13: GNR in blood. Hx of back pain and chills, concern is UTI.  P:   - abx as above - ceftaz, vanc ( recent dc) - check PCT, lacitate - check renal US - wound care consult called 05/11/13 - follow cultures as above  ENDOCRINE A:   No acute issues P:   - monitor CBGs  NEUROLOGIC A:   No acute issues P:   - monitor off benzo  GLOBAL 05/11/13: move to sdu. Full code. Consult PT    Dr. Kalman Shan, M.D., [facility].C.P Pulmonary and Critical Care Medicine Staff Physician [facility] Pager: [phone], If no answer or between  15:00h - 7:00h: call [phone]  05/11/2013 11:04 AM

## 2013-05-11 NOTE — Consult Note (Signed)
WOC wound consult note  Reason for Consult: evaluation of bulla right leg. Pt with ruptured bulla right medial calf. Pt was seen by this [facility] 04/29/13 for same wound.  Pt with hx. Of right peroneal vein concerning for focal calf DVT and mod to high probability of PE on VQ scan.He has worn compression socks in the past, but most recently he was not been able to don them and was not wearing them during his discharge to home last week.  Palpable pulse on the affected extremity.  Wound type: bulla right medial calf, secondary to edema  Measurement: 9cm x 11cm x 0 (unchanged) Wound bed: ruptured bulla with roof intact  Drainage (amount, consistency, odor) none, with my gloved hand appears may have just a very minimal serous drainage on the most posterior wound edge. Periwound: intact  Dressing procedure/placement/frequency: xeroform gauze to protect this area, wrap with kerlix. For antibacterial effects and to protection from further injury.  Discussed POC with patient and bedside nurse.  Re consult if needed, will not follow at this time.  Thanks  Arrin Ishler [employer], CWOCN [phone])

## 2013-05-11 NOTE — Progress Notes (Signed)
Transferred  to 2C 14 via O2,, WC and monitor, RN to receive in room. Pt placed in bed and placed on O2

## 2013-05-11 NOTE — Progress Notes (Signed)
Attempted to call report to 2C. 

## 2013-05-11 NOTE — Progress Notes (Signed)
Report to 2C RN 

## 2013-05-11 NOTE — Care Management Note (Signed)
    Page 1 of 1   05/11/2013     11:39:57 AM   CARE MANAGEMENT NOTE 05/11/2013  Patient:  BALEY, LORIMER   Account Number:  [id number]  Date Initiated:  05/11/2013  Documentation initiated by:  Avie Arenas  Subjective/Objective Assessment:   Resp failure - requiring bipap.     Action/Plan:   Anticipated DC Date:  05/14/2013   Anticipated DC Plan:  HOME W HOME HEALTH SERVICES      DC Planning Services  CM consult      Choice offered to / List presented to:             Status of service:  In process, will continue to follow Medicare Important Message given?   (If response is "NO", the following Medicare IM given date fields will be blank) Date Medicare IM given:   Date Additional Medicare IM given:    Discharge Disposition:    Per UR Regulation:  Reviewed for med. necessity/level of care/duration of stay  If discussed at Long Length of Stay Meetings, dates discussed:    Comments:  Contact:  Yarden, Hillis [phone]   Ridge,Janna Friend [phone]  05-11-13 8:40am Avie Arenas, RNBSN [phone] Lives at home alone - has home oxygen from [facility].  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  He does have a walker. has had HH prior with [facility].  Son and daughter live close and he has close neighbors who also help him out.  Ride home will not be an issue - the only issue he states would be too many rides as everyone wants to help out.  He does drive self to work - about a 10 minute drive.  CM will continue to follow for needs.

## 2013-05-12 ENCOUNTER — Inpatient Hospital Stay (HOSPITAL_COMMUNITY): Payer: Medicare Other

## 2013-05-12 DIAGNOSIS — J8410 Pulmonary fibrosis, unspecified: Secondary | ICD-10-CM

## 2013-05-12 DIAGNOSIS — R7881 Bacteremia: Secondary | ICD-10-CM | POA: Diagnosis present

## 2013-05-12 LAB — CBC WITH DIFFERENTIAL/PLATELET
Basophils Absolute: 0 10*3/uL (ref 0.0–0.1)
Basophils Relative: 0 % (ref 0–1)
Eosinophils Absolute: 0.1 10*3/uL (ref 0.0–0.7)
Eosinophils Relative: 0 % (ref 0–5)
HCT: 34.3 % — ABNORMAL LOW (ref 39.0–52.0)
MCH: 31.1 pg (ref 26.0–34.0)
MCHC: 33.8 g/dL (ref 30.0–36.0)
MCV: 92 fL (ref 78.0–100.0)
Monocytes Absolute: 0.7 10*3/uL (ref 0.1–1.0)
Neutrophils Relative %: 89 % — ABNORMAL HIGH (ref 43–77)
Platelets: 237 10*3/uL (ref 150–400)
RDW: 14.3 % (ref 11.5–15.5)

## 2013-05-12 LAB — GLUCOSE, CAPILLARY
Glucose-Capillary: 123 mg/dL — ABNORMAL HIGH (ref 70–99)
Glucose-Capillary: 120 mg/dL — ABNORMAL HIGH (ref 70–99)
Glucose-Capillary: 110 mg/dL — ABNORMAL HIGH (ref 70–99)

## 2013-05-12 LAB — BASIC METABOLIC PANEL
BUN: 38 mg/dL — ABNORMAL HIGH (ref 6–23)
Creatinine, Ser: 1.25 mg/dL (ref 0.50–1.35)
GFR calc Af Amer: 67 mL/min — ABNORMAL LOW (ref >90)
GFR calc non Af Amer: 57 mL/min — ABNORMAL LOW (ref >90)

## 2013-05-12 LAB — TROPONIN I: Troponin I: <0.3 ng/mL (ref <0.30)

## 2013-05-12 LAB — LACTIC ACID, PLASMA: Lactic Acid, Venous: 1.9 mmol/L (ref 0.5–2.2)

## 2013-05-12 LAB — MAGNESIUM: Magnesium: 2.5 mg/dL (ref 1.5–2.5)

## 2013-05-12 NOTE — Progress Notes (Addendum)
PULMONARY  / CRITICAL CARE MEDICINE  Name: Joshua Booth MRN: [id number] DOB: 05/03/1946    ADMISSION DATE:  05/10/2013 CONSULTATION DATE:  05/10/2013  REFERRING MD :  [facility] PRIMARY SERVICE: PCCM  CHIEF COMPLAINT:  Shortness of breath, low back pain   BRIEF PATIENT DESCRIPTION:   67 y/o male with PMH of HTN, HLD, CAD s/p Cath 2011, and IPF/UIP (TBBx via bronch 2008: non diagnostic, ANA, RF - negative 2008, CT Chestg 2011 - definitive UIP pattern. On immuiran/pred. Per hx too advanced for Pirfenidone EAP program. Baseline 4L at rest, 6L with exertion)    Pt recently hospitalized 9/23-9/28 with LE edema and increased sob found to have PE (based on intermed-high prob VQ RUL/LUL + right peroneal veins DVT) . DC'ed on  Xarelto (15 mg BID until 10/17 with transition to 20 mg Qd on 10/18.)   On 05/10/2013 went to [facility] with acute onset of low back pain nos and chills but no change in dyspnea. In ER, due to patient movement dyspnea got worse placed on bipap and admitted to ICU on bipap   LINES / TUBES: PIV  CULTURES: Sputum 10/6: rare GPC in pairs and GNR/ NF>>> BCx2 10/6>>>   ANTIBIOTICS: Ceftaz 10/6 >>> Vanc 10/6>>>  SIGNIFICANT EVENTS / STUDIES:  9/23 -9/28 - Patient admitted by [facility] found to have PE. Discharged on xarelto 9/24 Echo showing EF 55-60%, mild pulmonary HTN, grade 1 diastolic dysfunction ....................................................................................................................................Marland Kitchen 10/6 - Admitted from [facility] with worsening SOB. RT reports pt comfortable on bipap    SUBJECTIVE/OVERNIGHT/INTERVAL HX Feels at baseline   VITAL SIGNS: Temp:  [97.4 F (36.3 C)-98.5 F (36.9 C)] 98.5 F (36.9 C) (10/08 0811) Pulse Rate:  [26-91] 73 (10/08 0811) Resp:  [16-34] 30 (10/08 0811) BP: (110-144)/(44-93) 119/93 mmHg (10/08 0811) SpO2:  [93 %-100 %] 96 % (10/08 0811) Weight:  [89.4 kg (197 lb 1.5 oz)] 89.4 kg (197 lb 1.5  oz) (10/08 0447) 4 liters  HEMODYNAMICS:    VENTILATOR SETTINGS:    INTAKE / OUTPUT: Intake/Output     10/07 0701 - 10/08 0700 10/08 0701 - 10/09 0700   P.O. 660 180   I.V. (mL/kg) 160 (1.8) 20 (0.2)   IV Piggyback 450    Total Intake(mL/kg) 1270 (14.2) 200 (2.2)   Urine (mL/kg/hr) 1775 (0.8) 200 (1)   Total Output 1775 200   Net -505 0        Urine Occurrence 1 x    Stool Occurrence 1 x      PHYSICAL EXAMINATION: General: WDWN male, 4 L [place], sitting at bedside. eating breakfast, NAD Neuro:  A& O x3, follows commands appropriately HEENT:  NCAT, PEERL Cardiovascular:  RRR, no m/r/g  Lungs: RR 27, small volumes, difficulty with sentences, bilateral extensive LL crackles - all appear baseline Abdomen: soft, + BS  Musculoskeletal:  MAEs, 1+ edema bilaterally Skin:  Warm and dry, blistering on RLE covered in clean dry dresing   PULMONARY No results found for this basename: PHART, PCO2, PCO2ART, PO2, PO2ART, HCO3, TCO2, O2SAT,  in the last 168 hours  CBC  Recent Labs Lab 05/10/13 1629 05/11/13 0530 05/12/13 0320  HGB 11.2* 10.6* 11.6*  HCT 33.7* 32.2* 34.3*  WBC 24.2* 20.1* 19.1*  PLT 208 203 237    COAGULATION No results found for this basename: INR,  in the last 168 hours  CARDIAC    Recent Labs Lab 05/11/13 1135 05/11/13 2110 05/12/13 0320  TROPONINI <0.30 <0.30 <  0.30    Recent Labs Lab 05/12/13 0320  PROBNP 1881.0*     CHEMISTRY  Recent Labs Lab 05/10/13 1629 05/11/13 0530 05/12/13 0320  NA 141 144 143  K 4.1 4.4 4.3  CL 99 101 101  CO2 34* 35* 33*  GLUCOSE 152* 104* 106*  BUN 27* 32* 38*  CREATININE 1.31 1.33 1.25  CALCIUM 9.1 9.0 8.9  MG  --   --  2.5  PHOS  --   --  2.7   Estimated Creatinine Clearance: 63.7 ml/min (by C-G formula based on Cr of 1.25).   LIVER No results found for this basename: AST, ALT, ALKPHOS, BILITOT, PROT, ALBUMIN, INR,  in the last 168 hours   INFECTIOUS  Recent Labs Lab 05/11/13 1135  05/12/13 0320  LATICACIDVEN  --  1.9  PROCALCITON 4.73 2.99     ENDOCRINE CBG (last 3)   Recent Labs  05/11/13 1659 05/11/13 2325 05/12/13 0542  GLUCAP 134* 123* 114*    IMAGING x48h  Dg Chest Port 1 View  05/11/2013   CLINICAL DATA:  Shortness of breath, airspace disease, history hypertension, coronary disease, CHF, pulmonary fibrosis  EXAM: PORTABLE CHEST - 1 VIEW  COMPARISON:  Portable exam 0603 hr compared to 05/10/2013  FINDINGS: Enlargement of cardiac silhouette.  Calcified tortuous aorta.  Diffuse interstitial infiltrates at the mid to lower lungs bilaterally greater on the left compatible with history of pulmonary fibrosis.  Area of more focal opacity at the right lung base is perhaps minimally smaller.  Persistent elevation of right diaphragm.  No gross pleural effusion or pneumothorax.  IMPRESSION: Enlargement of cardiac silhouette.  Bibasilar pulmonary fibrosis.  Persistent superimposed opacity at the lower right lung, perhaps slightly smaller versus previous exam, consistent with pneumonia.   Electronically Signed   By: Ulyses Southward M.D.   On: 05/11/2013 08:05   Dg Chest Port 1 View  05/10/2013   CLINICAL DATA:  Acute respiratory failure  EXAM: PORTABLE CHEST - 1 VIEW  COMPARISON:  05/10/2013  FINDINGS: Cardiomediastinal silhouette is stable. Persistent nodular opacity right lower lobe. Stable scarring and fibrotic changes left lower lobe. No pulmonary edema.  IMPRESSION: Persistent nodular opacity right lower lobe. Stable scarring in left lower lobe.   Electronically Signed   By: Natasha Mead M.D.   On: 05/10/2013 16:13    ASSESSMENT / PLAN:  PULMONARY A: Baseline  - IPF / UIP :severe, on immuran/pred  - Hx PE 9/23  - Likely sleep apnea  Admitted w/ HCAP 10/6 On 05/11/13: baseline, CXR improving  P:   - continue current pred and imuran - continue xarelto for PE - See infectious section   - Will need a sleep study for OSA/OHS as outpatient  CARDIOVASCULAR A:  CHF  - acute exacerbation/ now better from symptom stand-point CAD Hypertension Hyperlipidemia P:  - Continue ASA/ atorvastatin/Bystolic  -  Continue Lasix home dose   RENAL A:   Flank pain.  Resolved.  P:   -renal US ordered. Think that this was r/t his PNA   GASTROINTESTINAL A:  No acute issues P:   - Continue Heart diet pending resp status  HEMATOLOGIC A:   PE/DVT prophylaxis - recent PE 9/23 P:  - continue Xarelto - consider heparin gtt if further procedures needed  INFECTIOUS A:   HCAP Right basilar airspace disease improving  AT risk for MDR and HCAP and PCP  due to immuran/pred R LE blister/wound  P:   - Continue Ceftaz, vanc  -  wound care consult called 05/11/13. WOC thinks following is unnecessary.   - will await final sputum culture results, if negative will narrow to mono-coverage w/ levaquin and complete 10d total.   ENDOCRINE A:   No acute issues P:   - monitor CBGs  NEUROLOGIC A:   No acute issues P:   - monitor off benzo  Will transfer to med/surg. May be able to go home in next 24-48 hrs.   Pedro Earls NP-S  Pulmonary and Critical Care Medicine [facility] and Critical [facility]  Continue steroids and diureses as ordered, patient needs to be maintained as dry as possible.  O2 as ordered since patient is almost at baseline.  Patient seen and examined, agree with above note.  I dictated the care and orders written for this patient under my direction.  Alyson Reedy, MD [phone]

## 2013-05-12 NOTE — Progress Notes (Signed)
Physical Therapy Evaluation Patient Details Name: Joshua Booth MRN: [id number] DOB: 06-09-1946 Today's Date: 05/12/2013 Time: 1000-1019 PT Time Calculation (min): 19 min  PT Assessment / Plan / Recommendation History of Present Illness  Pt admit with low back pain and fever.  Bipap secondary to respiratory issues.  On 5LO2 on arrival to room today.  Clinical Impression  Pt admitted with above. Pt currently with functional limitations due to the deficits listed below (see PT Problem List). Should progress and go home with HHPT only.  Pt will benefit from skilled PT to increase their independence and safety with mobility to allow discharge to the venue listed below.     PT Assessment  Patient needs continued PT services    Follow Up Recommendations  Home health PT;Supervision/Assistance - 24 hour        Barriers to Discharge Decreased caregiver support      Equipment Recommendations  None recommended by PT         Frequency Min 3X/week    Precautions / Restrictions Precautions Precautions: Fall Precaution Comments: watch O2 sats Restrictions Weight Bearing Restrictions: No   Pertinent Vitals/Pain Desat to 83% on 6LO2 with activity, O2 incr to >90% with rest breaks in standing with pursed lip breathing.  DOE 3/4 with activity;  no pain      Mobility  Bed Mobility Bed Mobility: Supine to Sit Supine to Sit: 6: Modified independent (Device/Increase time);HOB elevated;With rails Transfers Transfers: Sit to Stand;Stand to Sit Sit to Stand: 5: Supervision;From elevated surface;From chair/3-in-1 Stand to Sit: 5: Supervision;With upper extremity assist;To chair/3-in-1 Details for Transfer Assistance: cueing for hand placement with use of RW Ambulation/Gait Ambulation/Gait Assistance: 4: Min assist Ambulation Distance (Feet): 100 Feet Assistive device: Rolling walker Ambulation/Gait Assistance Details: cuing for posture as well as position in RW with multiple standing rest  breaks as sats would dip to 83% on 6LO2 with ambulation and pt would rest and pursed lip breathe to get sats up and then continue.   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  Bipap secondary to respiratory issues.  On 5LO2 on arrival to room today.       Prior Functioning  Home Living Family/patient expects to be discharged to:: Private residence Living Arrangements: Alone Available Help at Discharge: Family;Available PRN/intermittently Type of Home: House Home Access: Stairs to enter [employer] of Steps: 2 Entrance Stairs-Rails: Right Home Layout: One level Home Equipment: Walker - 4 wheels;Cane - single point;Bedside commode;Tub bench;Hand held shower head Prior Function Level of Independence: Independent with assistive device(s) Communication Communication: No difficulties Dominant Hand: Right    Cognition  Cognition Arousal/Alertness: Awake/alert Behavior During Therapy: WFL for tasks assessed/performed Overall Cognitive Status: Within Functional Limits for tasks assessed    Extremity/Trunk Assessment Upper Extremity Assessment Upper Extremity  Assessment:  Defer to OT evaluation Lower Extremity Assessment Lower Extremity Assessment: Generalized weakness Cervical / Trunk Assessment Cervical / Trunk Assessment: Normal   Balance Balance Balance Assessed: Yes Static Standing Balance Static Standing - Balance Support: Bilateral upper extremity supported;During functional activity Static Standing - Level of Assistance: 4: Min assist Static Standing - Comment/# of Minutes: 2 High Level Balance High Level Balance Activites: Direction changes;Turns;Sudden stops High Level Balance Comments: min asssist for high level activities with RW.  End of Session PT - End of Session Equipment Utilized During Treatment: Gait belt;Oxygen Activity Tolerance: Patient limited by fatigue Patient left: with call bell/phone within reach;Other (comment) (on 3N1) Nurse Communication: Mobility status       INGOLD,Barack Nicodemus 05/12/2013, 2:34 PM [facility] Acute Rehabilitation [phone] (pager)

## 2013-05-13 ENCOUNTER — Inpatient Hospital Stay (HOSPITAL_COMMUNITY): Payer: Medicare Other

## 2013-05-13 DIAGNOSIS — I279 Pulmonary heart disease, unspecified: Secondary | ICD-10-CM

## 2013-05-13 LAB — CBC WITH DIFFERENTIAL/PLATELET
Basophils Absolute: 0 10*3/uL (ref 0.0–0.1)
Basophils Relative: 0 % (ref 0–1)
HCT: 33 % — ABNORMAL LOW (ref 39.0–52.0)
Hemoglobin: 11 g/dL — ABNORMAL LOW (ref 13.0–17.0)
Lymphocytes Relative: 5 % — ABNORMAL LOW (ref 12–46)
Lymphs Abs: 0.7 10*3/uL (ref 0.7–4.0)
MCH: 30.3 pg (ref 26.0–34.0)
MCHC: 33.3 g/dL (ref 30.0–36.0)
Monocytes Absolute: 1.2 10*3/uL — ABNORMAL HIGH (ref 0.1–1.0)
Monocytes Relative: 9 % (ref 3–12)
Neutro Abs: 11.5 10*3/uL — ABNORMAL HIGH (ref 1.7–7.7)
Platelets: 220 10*3/uL (ref 150–400)
RBC: 3.63 MIL/uL — ABNORMAL LOW (ref 4.22–5.81)

## 2013-05-13 LAB — CULTURE, RESPIRATORY W GRAM STAIN
Culture: NORMAL
Gram Stain: NONE SEEN

## 2013-05-13 LAB — BASIC METABOLIC PANEL
CO2: 35 mEq/L — ABNORMAL HIGH (ref 19–32)
Calcium: 8.5 mg/dL (ref 8.4–10.5)
Chloride: 99 mEq/L (ref 96–112)
Creatinine, Ser: 1.23 mg/dL (ref 0.50–1.35)
GFR calc Af Amer: 68 mL/min — ABNORMAL LOW (ref >90)
Glucose, Bld: 90 mg/dL (ref 70–99)
Potassium: 3.7 mEq/L (ref 3.5–5.1)

## 2013-05-13 LAB — PROCALCITONIN: Procalcitonin: 1.25 ng/mL

## 2013-05-13 LAB — GLUCOSE, CAPILLARY
Glucose-Capillary: 92 mg/dL (ref 70–99)
Glucose-Capillary: 107 mg/dL — ABNORMAL HIGH (ref 70–99)
Glucose-Capillary: 137 mg/dL — ABNORMAL HIGH (ref 70–99)

## 2013-05-13 LAB — PHOSPHORUS: Phosphorus: 2.4 mg/dL (ref 2.3–4.6)

## 2013-05-13 LAB — MAGNESIUM: Magnesium: 2.4 mg/dL (ref 1.5–2.5)

## 2013-05-13 MED ORDER — MOXIFLOXACIN HCL 400 MG PO TABS: 400.0000 mg | ORAL_TABLET | Freq: Every day | ORAL | Status: DC

## 2013-05-13 NOTE — Discharge Summary (Signed)
Physician Discharge Summary     Patient ID: Joshua Booth MRN: [id number] DOB/AGE: 68-30-1947 68 y.o.  Admit date: 05/10/2013 Discharge date: 05/13/2013  Discharge Diagnoses:   Acute on Chronic respiratory failure  IPF / UIP :severe, on immuran/pred  Hx PE 9/23  HCAP 10/6 Possible sleep apnea  CHF CAD  Hypertension  Hyperlipidemia DVT R LE blister/wound: venous stasis ulcer   Detailed Hospital Course:    25 male with PMH of HTN, HLD, CAD s/p Cath 2011, and IPF/UIP on prednisone 20 mg QD presented to [facility] with severe low back pain and increased sob on 10/6. Pt recently hospitalized 9/23-9/28 with LE edema and increased sob found to have PE. He was found to have right peroneal veins DVT. CXR was unremarkable and initial cardiac enzymes negative. Perfusion lung scanned completed 9/23 revealed wedge-shaped, subsegmental peripheral defects in the right upper lobe and left upper lobe garnering high suspicion for PE. Patient was placed on LMWH and coumadin (9/23). Subsequently transitioned to Xarelto since discharge.  On arrival, patient placed on Bipap. Denied fever, urinary symptoms, changes in BM, chest pain, n/v/d/. Complained of low back pain. Reports he has been faithfully taking xarelto. Plan was for 15 mg BID until 10/17 with transition to 20 mg Qd on 10/18. PCXR showed RLL consolidation c/w PNA. He was initially admitted to the ICU given concern for possible need for intubation. He was treated with the following: brief NIPPV, empiric antibiotics, supplemental oxygen, bronchodilators and ICU level monitoring. His pulmonary status responded nicely over the following 24-48 hrs. We were able to transfer him out of the ICU on 10/7 and we further de-escalated his status to medsurg on 10/8. At time of discharge his CXR has improved, his flank pain which was likely due to his pneumonia has resolved and he is at baseline pulmonary status. Culture data was reviewed. No growth to date. MRSA  swab negative. Sputum was c/w normal flora. At time of discharge he has met maximum benefit from in-patient care. He is clear for discharge with plan to be outlined below.    Discharge Plan by diagnoses   Acute on Chronic respiratory failure in the setting of HCAP (NOS) superimposed on underlying: IPF / UIP: severe, on immuran/pred. Complicated by Recent PE 9/23   Possible sleep apnea  P:  - complete 10d total abx. Will send him home on 7 more days of avelox - home on current pred and imuran  - continue xarelto for PE  - f/u our office 1 week Oct 14 at 930 am T [place]  - Will need a sleep study for OSA/OHS as outpatient   CHF - acute exacerbation/ now better from symptom stand-point.   CAD  Hypertension  Hyperlipidemia  P:  - Continue ASA/ atorvastatin/Bystolic  - Continue Lasix home dose   PE/DVT prophylaxis - recent PE 9/23  P:  - continue Xarelto as planned   Significant Hospital tests/ studies/ interventions and procedures CULTURES:  Sputum 10/6: NF BCx2 10/6>>>   ANTIBIOTICS:  Ceftaz 10/6 >>>10/9  Vanc 10/6>>> 10/9 avelox 10/9>>>  SIGNIFICANT EVENTS / STUDIES:  9/23 -9/28 - Patient admitted by [facility] found to have PE. Discharged on xarelto  9/24 Echo showing EF 55-60%, mild pulmonary HTN, grade 1 diastolic dysfunction  ....................................................................................................................................Marland Kitchen  10/6 - Admitted from [facility] with worsening SOB. RT reports pt comfortable on bipap  Discharge Exam: BP 127/61  Pulse 90  Temp(Src) 98.4 F (36.9 C) (Oral)  Resp 26  Ht  5\' 10"  (1.778 m)  Wt 90.2 kg (198 lb 13.7 oz)  BMI 28.53 kg/m2  SpO2 94% 4 liters  PHYSICAL EXAMINATION:  General: WDWN male, 4 L [place], sitting at bedside. eating breakfast, NAD  Neuro: A& O x3, follows commands appropriately  HEENT: NCAT, PEERL  Cardiovascular: RRR, no m/r/g  Lungs: RR 27, small volumes, difficulty with sentences but  improved from previous, bilateral extensive LL crackles - all appear baseline  Abdomen: soft, + BS  Musculoskeletal: MAEs  Skin: Warm and dry, blistering on RLE covered in clean dry dresing   Labs at discharge Lab Results  Component Value Date   CREATININE 1.23 05/13/2013   BUN 32* 05/13/2013   NA 141 05/13/2013   K 3.7 05/13/2013   CL 99 05/13/2013   CO2 35* 05/13/2013   Lab Results  Component Value Date   WBC 13.6* 05/13/2013   HGB 11.0* 05/13/2013   HCT 33.0* 05/13/2013   MCV 90.9 05/13/2013   PLT 220 05/13/2013   Lab Results  Component Value Date   ALT 14 04/28/2013   AST 19 04/28/2013   ALKPHOS 57 04/28/2013   BILITOT 0.7 04/28/2013   Lab Results  Component Value Date   INR 2.90* 04/30/2013   INR 1.23 04/29/2013   INR 1.13 04/28/2013    Current radiology studies Dg Chest 1 View  05/12/2013   CLINICAL DATA:  FOLLOW UP right lower lobe pneumonia, shortness of breath.  EXAM: CHEST - 1 VIEW  COMPARISON:  05/11/2013 and CT chest 06/12/2012. Chest radiograph 04/27/2013.  FINDINGS: Heart size stable. Lungs are low in volume with a coarsened reticular pattern, somewhat patchy and greater on the left. Slight improvement in an ovoid opacity in the right lower lung zone. No definite pleural fluid.  IMPRESSION: 1. Slight improvement in an ovoid density at the right lung base, without complete resolution. Finding appears new from 04/27/2013, favoring an infectious or inflammatory etiology. 2. Advanced pulmonary fibrosis.   Electronically Signed   By: Leanna Battles M.D.   On: 05/12/2013 15:34   US Renal  05/12/2013   CLINICAL DATA:  Impaired renal function.  EXAM: RENAL/URINARY TRACT ULTRASOUND COMPLETE  COMPARISON:  None.  FINDINGS: Right Kidney  Length: 10.8 cm. Echogenicity within normal limits. No mass or hydronephrosis visualized.  Left Kidney  Length: 10.6 cm. Echogenicity within normal limits. No mass or hydronephrosis visualized.  Bladder:  Appears normal for degree of bladder distention.   IMPRESSION: Normal exam.   Electronically Signed   By: Geanie Cooley M.D.   On: 05/12/2013 17:08   Dg Chest Port 1 View  05/13/2013   CLINICAL DATA:  Respiratory failure. Shortness of breath.  EXAM: PORTABLE CHEST - 1 VIEW  COMPARISON:  05/12/2013  FINDINGS: Elevated right hemidiaphragm. Colonic interposition of bowel limiting evaluation for detection of free intraperitoneal air.  Cardiomegaly.  Calcified tortuous aorta.  Asymmetric airspace disease with similar appearance to the prior examination. Some of the findings represent changes of pulmonary fibrosis. Patchy consolidation right lung base. Recommend followup until clearance.  IMPRESSION: No significant change. Please see above.   Electronically Signed   By: Bridgett Larsson M.D.   On: 05/13/2013 07:32    Disposition:  01-Home or Self Care      Discharge Orders   Future Appointments Provider Department Dept Phone   05/18/2013 9:30 AM Julio Sicks, NP [facility] @ [facility] [phone]   05/24/2013 2:00 PM Storm Frisk, MD [facility] @ [facility] [phone]  Future Orders Complete By Expires   Diet - low sodium heart healthy  As directed    Increase activity slowly  As directed        Medication List         atorvastatin 40 MG tablet  Commonly known as:  LIPITOR  Take 40 mg by mouth at bedtime.     azaTHIOprine 50 MG tablet  Commonly known as:  IMURAN  Take 50 mg by mouth daily.     BYSTOLIC 10 MG tablet  Generic drug:  nebivolol  Take 10 mg by mouth Daily.     FOLBEE PLUS PO  Take by mouth daily.     furosemide 20 MG tablet  Commonly known as:  LASIX  Take 2 tablets (40 mg total) by mouth 2 (two) times daily.     glucosamine-chondroitin 500-400 MG tablet  Take 2 tablets by mouth daily.     KLOR-CON M20 20 MEQ tablet  Generic drug:  potassium chloride SA  Take 10 mEq by mouth 2 (two) times daily.     moxifloxacin 400 MG tablet  Commonly known as:  AVELOX  Take 1 tablet (400 mg total) by  mouth daily.     multivitamin tablet  Take 1 tablet by mouth daily.     NAC 600 600 MG Caps  Generic drug:  Acetylcysteine  Take by mouth. 2 in am and 1 at bedtime     NITROSTAT 0.4 MG SL tablet  Generic drug:  nitroGLYCERIN  Place 0.4 mg under the tongue every 5 (five) minutes as needed for chest pain.     predniSONE 10 MG tablet  Commonly known as:  DELTASONE  Take 20 mg by mouth daily.     Rivaroxaban 15 MG Tabs tablet  Commonly known as:  XARELTO  Take 1 tablet (15 mg total) by mouth 2 (two) times daily with a meal. For 3 weeks, till 10/16     Rivaroxaban 20 MG Tabs tablet  Commonly known as:  XARELTO  Take 1 tablet (20 mg total) by mouth daily with supper. Starting 10/17  Start taking on:  05/21/2013         Discharged Condition: good  Physician Statement:   The Patient was personally examined, the discharge assessment and plan has been personally reviewed and I agree with ACNP Babcock's assessment and plan. > 30 minutes of time have been dedicated to discharge assessment, planning and discharge instructions.   Signed: BABCOCK,PETE 05/13/2013, 11:35 AM  D/C time spent >45 minutes.  Patient seen and examined, agree with above note.  I dictated the care and orders written for this patient under my direction.  Alyson Reedy, MD [phone]

## 2013-05-13 NOTE — Progress Notes (Signed)
PULMONARY  / CRITICAL CARE MEDICINE  Name: DAWID DUPRIEST MRN: [id number] DOB: 01/21/1946    ADMISSION DATE:  05/10/2013 CONSULTATION DATE:  05/10/2013  REFERRING MD :  [facility] PRIMARY SERVICE: PCCM  CHIEF COMPLAINT:  Shortness of breath, low back pain   BRIEF PATIENT DESCRIPTION:   67 y/o male with PMH of HTN, HLD, CAD s/p Cath 2011, and IPF/UIP (TBBx via bronch 2008: non diagnostic, ANA, RF - negative 2008, CT Chestg 2011 - definitive UIP pattern. On immuiran/pred. Per hx too advanced for Pirfenidone EAP program. Baseline 4L at rest, 6L with exertion)    Pt recently hospitalized 9/23-9/28 with LE edema and increased sob found to have PE (based on intermed-high prob VQ RUL/LUL + right peroneal veins DVT) . DC'ed on  Xarelto (15 mg BID until 10/17 with transition to 20 mg Qd on 10/18.)   On 05/10/2013 went to [facility] with acute onset of low back pain nos and chills but no change in dyspnea. In ER, due to patient movement dyspnea got worse placed on bipap and admitted to ICU on bipap   LINES / TUBES: PIV  CULTURES: Sputum 10/6: rare GPC in pairs and GNR/ NF>>> BCx2 10/6>>>   ANTIBIOTICS: Ceftaz 10/6 >>> Vanc 10/6>>>  SIGNIFICANT EVENTS / STUDIES:  9/23 -9/28 - Patient admitted by [facility] found to have PE. Discharged on xarelto 9/24 Echo showing EF 55-60%, mild pulmonary HTN, grade 1 diastolic dysfunction ....................................................................................................................................Marland Kitchen 10/6 - Admitted from [facility] with worsening SOB. RT reports pt comfortable on bipap    SUBJECTIVE/OVERNIGHT/INTERVAL HX Feels at baseline   VITAL SIGNS: Temp:  [98.3 F (36.8 C)-98.9 F (37.2 C)] 98.4 F (36.9 C) (10/09 0438) Pulse Rate:  [53-90] 90 (10/09 0806) Resp:  [21-33] 26 (10/09 0438) BP: (114-147)/(59-71) 127/61 mmHg (10/09 0438) SpO2:  [94 %-100 %] 94 % (10/09 0806) Weight:  [90.2 kg (198 lb 13.7 oz)] 90.2 kg (198 lb 13.7  oz) (10/09 0438) 4 liters  HEMODYNAMICS:    VENTILATOR SETTINGS:    INTAKE / OUTPUT: Intake/Output     10/08 0701 - 10/09 0700 10/09 0701 - 10/10 0700   P.O. 180    I.V. (mL/kg) 20 (0.2)    IV Piggyback 450    Total Intake(mL/kg) 650 (7.2)    Urine (mL/kg/hr) 1900 (0.9)    Total Output 1900     Net -1250            PHYSICAL EXAMINATION: General: WDWN male, 4 L [place], sitting at bedside. eating breakfast, NAD Neuro:  A& O x3, follows commands appropriately HEENT:  NCAT, PEERL Cardiovascular:  RRR, no m/r/g  Lungs: RR 27, small volumes, difficulty with sentences but improved from previous, bilateral extensive LL crackles - all appear baseline Abdomen: soft, + BS  Musculoskeletal:  MAEs  Skin:  Warm and dry, blistering on RLE covered in clean dry dresing   PULMONARY No results found for this basename: PHART, PCO2, PCO2ART, PO2, PO2ART, HCO3, TCO2, O2SAT,  in the last 168 hours  CBC  Recent Labs Lab 05/11/13 0530 05/12/13 0320 05/13/13 0408  HGB 10.6* 11.6* 11.0*  HCT 32.2* 34.3* 33.0*  WBC 20.1* 19.1* 13.6*  PLT 203 237 220    COAGULATION No results found for this basename: INR,  in the last 168 hours  CARDIAC    Recent Labs Lab 05/11/13 1135 05/11/13 2110 05/12/13 0320  TROPONINI <0.30 <0.30 <0.30    Recent Labs Lab 05/12/13 0320  PROBNP 1881.0*  CHEMISTRY  Recent Labs Lab 05/10/13 1629 05/11/13 0530 05/12/13 0320 05/13/13 0408  NA 141 144 143 141  K 4.1 4.4 4.3 3.7  CL 99 101 101 99  CO2 34* 35* 33* 35*  GLUCOSE 152* 104* 106* 90  BUN 27* 32* 38* 32*  CREATININE 1.31 1.33 1.25 1.23  CALCIUM 9.1 9.0 8.9 8.5  MG  --   --  2.5 2.4  PHOS  --   --  2.7 2.4   Estimated Creatinine Clearance: 65 ml/min (by C-G formula based on Cr of 1.23).   LIVER No results found for this basename: AST, ALT, ALKPHOS, BILITOT, PROT, ALBUMIN, INR,  in the last 168 hours   INFECTIOUS  Recent Labs Lab 05/11/13 1135 05/12/13 0320 05/13/13 0408   LATICACIDVEN  --  1.9  --   PROCALCITON 4.73 2.99 1.25     ENDOCRINE CBG (last 3)   Recent Labs  05/12/13 1736 05/13/13 0112 05/13/13 0644  GLUCAP 110* 92 107*    IMAGING x48h  Dg Chest 1 View  05/12/2013   CLINICAL DATA:  FOLLOW UP right lower lobe pneumonia, shortness of breath.  EXAM: CHEST - 1 VIEW  COMPARISON:  05/11/2013 and CT chest 06/12/2012. Chest radiograph 04/27/2013.  FINDINGS: Heart size stable. Lungs are low in volume with a coarsened reticular pattern, somewhat patchy and greater on the left. Slight improvement in an ovoid opacity in the right lower lung zone. No definite pleural fluid.  IMPRESSION: 1. Slight improvement in an ovoid density at the right lung base, without complete resolution. Finding appears new from 04/27/2013, favoring an infectious or inflammatory etiology. 2. Advanced pulmonary fibrosis.   Electronically Signed   By: Leanna Battles M.D.   On: 05/12/2013 15:34   US Renal  05/12/2013   CLINICAL DATA:  Impaired renal function.  EXAM: RENAL/URINARY TRACT ULTRASOUND COMPLETE  COMPARISON:  None.  FINDINGS: Right Kidney  Length: 10.8 cm. Echogenicity within normal limits. No mass or hydronephrosis visualized.  Left Kidney  Length: 10.6 cm. Echogenicity within normal limits. No mass or hydronephrosis visualized.  Bladder:  Appears normal for degree of bladder distention.  IMPRESSION: Normal exam.   Electronically Signed   By: Geanie Cooley M.D.   On: 05/12/2013 17:08   Dg Chest Port 1 View  05/13/2013   CLINICAL DATA:  Respiratory failure. Shortness of breath.  EXAM: PORTABLE CHEST - 1 VIEW  COMPARISON:  05/12/2013  FINDINGS: Elevated right hemidiaphragm. Colonic interposition of bowel limiting evaluation for detection of free intraperitoneal air.  Cardiomegaly.  Calcified tortuous aorta.  Asymmetric airspace disease with similar appearance to the prior examination. Some of the findings represent changes of pulmonary fibrosis. Patchy consolidation right lung  base. Recommend followup until clearance.  IMPRESSION: No significant change. Please see above.   Electronically Signed   By: Bridgett Larsson M.D.   On: 05/13/2013 07:32    ASSESSMENT / PLAN:  PULMONARY A: Baseline  - IPF / UIP :severe, on immuran/pred  - Hx PE 9/23  - Likely sleep apnea  Admitted w/ HCAP 10/6 On 05/12/13: baseline, CXR improving  P:   - continue current pred and imuran - continue xarelto for PE - See infectious section   - Will need a sleep study for OSA/OHS as outpatient  CARDIOVASCULAR A:  CHF - acute exacerbation/ now better from symptom stand-point CAD Hypertension Hyperlipidemia P:  - Continue ASA/ atorvastatin/Bystolic  - Continue Lasix home dose   RENAL A:   Flank pain.  Resolved.  P:   - Renal US completed. Grossly normal.   GASTROINTESTINAL A:  No acute issues P:   - Continue Heart diet pending resp status  HEMATOLOGIC A:   PE/DVT prophylaxis - recent PE 9/23 P:  - continue Xarelto  INFECTIOUS A:   HCAP Right basilar airspace disease improving             AT risk for MDR and HCAP and PCP  due to immuran/pred R LE blister/wound WBC decreasing  P:   -narrow abx/ complete 10d total abx (will use levaquin)  ENDOCRINE A:   No acute issues P:   - monitor CBGs  NEUROLOGIC A:   No acute issues P:   - monitor off benzo  Summary:  67 yr old male with diagnosed IPF admitted 10/6 for HCAP currently on duel antibiotic therapy, 4 -6 liters O2 Gate (baseline requirement) , afebrile, with improving WBC count, exhibiting adequate intake/output, and ambulating comfortably. Sputum and blood cultures pending. He looks great, we will send him home.   Pedro Earls NP-S  Pulmonary and Critical Care Medicine [facility] Pulmonary and Critical Care

## 2013-05-13 NOTE — Clinical Documentation Improvement (Signed)
THIS DOCUMENT IS NOT A PERMANENT PART OF THE MEDICAL RECORD  Please update your documentation with the medical record to reflect your response to this query. If you need help knowing how to do this please call [phone].  05/13/13  Dear Dr.Feinstein Marton [place],  In a better effort to capture your patient's severity of illness, reflect appropriate length of stay and utilization of resources, a review of the patient medical record has revealed the following indicators.    Based on your clinical judgment, please clarify and document in a progress note and/or discharge summary the clinical condition associated with the following supporting information:  In responding to this query please exercise your independent judgment.  The fact that a query is asked, does not imply that any particular answer is desired or expected.   Possible Clinical Conditions?  Acute Respiratory Failure  Acute on Chronic Respiratory Failure  Chronic Respiratory Failure  Acute Respiratory Distress  Other Condition  Cannot Clinically Determine     Risk Factors: Respiratory distress related to GNR bacteremia, Bipap, noted per 10/07 progress notes.   You may use possible, probable, or suspect with inpatient documentation. possible, probable, suspected diagnoses MUST be documented at the time of discharge  Reviewed: Additional documentation provided per 10/09 discharge summary.                      Thank You,  Marciano Sequin,  Clinical Documentation Specialist: [phone] Health Information Management [place]

## 2013-05-13 NOTE — Clinical Documentation Improvement (Signed)
THIS DOCUMENT IS NOT A PERMANENT PART OF THE MEDICAL RECORD  Please update your documentation with the medical record to reflect your response to this query. If you need help knowing how to do this please call [phone].  05/13/13  Dear Dr.Feinstein/ Associates,  In a better effort to capture your patient's severity of illness, reflect appropriate length of stay and utilization of resources, a review of the patient medical record has revealed the following indicators the diagnosis of Heart Failure.    Based on your clinical judgment, please clarify and document in a progress note and/or discharge summary the clinical condition associated with the following supporting information:  In responding to this query please exercise your independent judgment.  The fact that a query is asked, does not imply that any particular answer is desired or expected.   Possible Clinical Conditions?  Acute Systolic Congestive Heart Failure Acute Diastolic Congestive Heart Failure Acute Systolic & Diastolic Congestive Heart Failure Acute on Chronic Systolic Congestive Heart Failure Acute on Chronic Diastolic Congestive Heart Failure Acute on Chronic Systolic & Diastolic  Congestive Heart Failure Other Condition Cannot Clinically Determine   Risk Factors: CHF, Acute exacerbation, continue Lasix home dose, noted per 10/08 progress notes.   Reviewed: Additional documentation per 10/09 discharge summary.  Thank You,  Marciano Sequin,  Clinical Documentation Specialist: [phone] Health Information Management [place]

## 2013-05-13 NOTE — Progress Notes (Signed)
Physical Therapy Treatment Patient Details Name: PRINTICE HELLMER MRN: [id number] DOB: 1945-10-23 Today's Date: 05/13/2013 Time: 4098-1191 PT Time Calculation (min): 30 min  PT Assessment / Plan / Recommendation  History of Present Illness Pt admit with low back pain and fever.  Bipap secondary to respiratory issues.  On 5LO2 on arrival to room today.   PT Comments   Pt familiar from prior admission and educated for HEP and encouraged continued mobility. Pt performing energy conservation and demonstrates understanding of how to progress activity at home. Will continue to follow acutely to maximize function prior to discharge.  Follow Up Recommendations  No PT follow up     Does the patient have the potential to tolerate intense rehabilitation     Barriers to Discharge        Equipment Recommendations       Recommendations for Other Services    Frequency     Progress towards PT Goals Progress towards PT goals: Progressing toward goals  Plan Discharge plan needs to be updated    Precautions / Restrictions Precautions Precautions: Fall Precaution Comments: watch O2 sats   Pertinent Vitals/Pain 90-99% on 6L 90HR No pain   Mobility  Bed Mobility Bed Mobility: Not assessed Transfers Sit to Stand: 6: Modified independent (Device/Increase time);From bed Stand to Sit: 6: Modified independent (Device/Increase time);To chair/3-in-1 Ambulation/Gait Ambulation/Gait Assistance: 5: Supervision Ambulation Distance (Feet): 170 Feet Assistive device: Rolling walker Ambulation/Gait Assistance Details: cueing for posture, pt able to grade activity on his own with 4 standing rest breaks to maintain sats >90 throughout Gait Pattern: Step-through pattern;Decreased stride length Gait velocity: decreased  Stairs: No    Exercises General Exercises - Lower Extremity Long Arc Quad: AROM;Both;20 reps;Seated Hip Flexion/Marching: AROM;Both;20 reps;Seated   PT Diagnosis:    PT Problem List:    PT Treatment Interventions:     PT Goals (current goals can now be found in the care plan section)    Visit Information  Last PT Received On: 05/13/13 Assistance Needed: +1 History of Present Illness: Pt admit with low back pain and fever.  Bipap secondary to respiratory issues.  On 5LO2 on arrival to room today.    Subjective Data      Cognition  Cognition Arousal/Alertness: Awake/alert Behavior During Therapy: WFL for tasks assessed/performed Overall Cognitive Status: Within Functional Limits for tasks assessed    Balance     End of Session PT - End of Session Equipment Utilized During Treatment: Gait belt;Oxygen Activity Tolerance: Patient limited by fatigue Patient left: with call bell/phone within reach;Other (comment)   GP     Toney Sang Beth 05/13/2013, 8:09 AM Delaney Meigs, PT [phone]

## 2013-05-17 LAB — CULTURE, BLOOD (ROUTINE X 2): Culture: NO GROWTH

## 2013-05-18 ENCOUNTER — Ambulatory Visit (HOSPITAL_BASED_OUTPATIENT_CLINIC_OR_DEPARTMENT_OTHER)
Admission: RE | Admit: 2013-05-18 | Discharge: 2013-05-18 | Disposition: A | Discharge status: Home / Self Care | Payer: Medicare Other | Source: Ambulatory Visit | Attending: Pulmonary Disease | Admitting: Pulmonary Disease

## 2013-05-18 ENCOUNTER — Ambulatory Visit (INDEPENDENT_AMBULATORY_CARE_PROVIDER_SITE_OTHER): Payer: Medicare Other | Admitting: Adult Health

## 2013-05-18 ENCOUNTER — Encounter: Payer: Self-pay | Admitting: Adult Health

## 2013-05-18 ENCOUNTER — Inpatient Hospital Stay: Payer: Medicare Other | Admitting: Adult Health

## 2013-05-18 VITALS — BP 122/62 | HR 80 | Temp 97.9°F | Ht 70.0 in | Wt 199.5 lb

## 2013-05-18 DIAGNOSIS — J189 Pneumonia, unspecified organism: Secondary | ICD-10-CM

## 2013-05-18 DIAGNOSIS — I2781 Cor pulmonale (chronic): Secondary | ICD-10-CM

## 2013-05-18 DIAGNOSIS — J8410 Pulmonary fibrosis, unspecified: Secondary | ICD-10-CM | POA: Insufficient documentation

## 2013-05-18 DIAGNOSIS — I279 Pulmonary heart disease, unspecified: Secondary | ICD-10-CM

## 2013-05-18 DIAGNOSIS — I2699 Other pulmonary embolism without acute cor pulmonale: Secondary | ICD-10-CM

## 2013-05-18 MED ORDER — NEBIVOLOL HCL 10 MG PO TABS: 10.0000 mg | ORAL_TABLET | Freq: Every day | ORAL | Status: DC

## 2013-05-18 MED ORDER — FOLBEE PLUS PO TABS: 1.0000 | ORAL_TABLET | Freq: Every day | ORAL | Status: AC

## 2013-05-18 NOTE — Patient Instructions (Addendum)
Continue on Xarelto 15mg  Twice daily  Until 10/17 begin Xarelto 20mg  daily  Finish Avelox as directed.  Keep legs elevated.  Discuss  sleep study on return- will look at when you are feeling better.  Please contact office for sooner follow up if symptoms do not improve or worsen or seek emergency care  Follow up Dr. Delford Field  Next week as planned.

## 2013-05-18 NOTE — Assessment & Plan Note (Addendum)
Recent HCAP clinically improving  cxr improved   Plan  Finish Avelox as directed.  Keep legs elevated.  Discuss  sleep study on return- will look at when you are feeling better.  Please contact office for sooner follow up if symptoms do not improve or worsen or seek emergency care  Follow up Dr. Delford Field  Next week as planned.

## 2013-05-18 NOTE — Assessment & Plan Note (Signed)
Recent flare now improved with diuresis .  

## 2013-05-18 NOTE — Assessment & Plan Note (Signed)
AT baseline  Cont on current regimen

## 2013-05-18 NOTE — Progress Notes (Signed)
Subjective:    Patient ID: Joshua Booth, male    DOB: 05/03/1946, 67 y.o.   MRN: [id number]  HPI  67 yo male with known hx of WM with IPF/UIP DLCO <30% predicted.   04/27/13 Acute OV  Complains of WM with IPF/UIP DLCO <30% predicted.  Pt c/o increase SOB w/ activity. Pt saw PCP last Monday and yesterday for swelling in ankles. Pt is scheduled for ECHO tomorrow.  Has trouble with lower extremity swelling , worse for last few weeks. Cant not stand without severe dypsnea. On arrival today O2 sat 73% on 5 l/m.   Was started on Metalazone 5mg  daily last week, weight is down 7 lbs. But no change in leg swelling or dyspnea/DOE.  Also on Lasix 40mg  daily .  Lisinopril HCT was stopped. Last week by PCP .  Currently on prednisone 20mg  daily . Sleeps in recliner.  No cough, fever, hemoptysis , abdominal pain or n/v.  Has water blister/Bullae on right leg that ruptured last week from severe leg swelling.  >admitted   05/18/2013 Post Hospital follow up  Pt admitted 04/27/13 found to have worsening hypoxia. Found to have a Left LE DVT and PE (high prob on VQ scan )  . Started bridge therapy and discharged on  Xarelto. Echo showed EF 55-60%, Gr 1 DD , mild RV dilatation. , PAP 47 . Did have acute diastolic HF , required diuresis.  Pt was readmitted on 10/6  for HC-PNA . Tx w/ abx and discharged on Avelox . Did require brief BIPAP support on admission. Sputum cx neg. Suggested that he have sleep study OP for possible OSA/OHS.   Since discharge he is feeling better. Still very weak with low energy. No hemoptysis, chest pain, orthopnea, fever.    Review of Systems  Constitutional:   No  weight loss, night sweats,  Fevers, chills,  +fatigue, or  lassitude.  HEENT:   No headaches,  Difficulty swallowing,  Tooth/dental problems, or  Sore throat,                No sneezing, itching, ear ache, nasal congestion, post nasal drip,   CV:  No chest pain,  Orthopnea, PND,  , anasarca, dizziness, palpitations,  syncope.   GI  No heartburn, indigestion, abdominal pain, nausea, vomiting, diarrhea, change in bowel habits, loss of appetite, bloody stools.   Resp:    No chest wall deformity  Skin: no rash or lesions.  GU: no dysuria, change in color of urine, no urgency or frequency.  No flank pain, no hematuria   MS:     No back pain.  Psych:  No change in mood or affect. No depression or anxiety.  No memory loss.         Objective:   Physical Exam  GEN: A/Ox3; pleasant , NAD,chronically ill appearing male in wheelchair   HEENT:  [place]/AT,  EACs-clear, TMs-wnl, NOSE-clear, THROAT-clear, no lesions, no postnasal drip or exudate noted.   NECK:  Supple w/ fair ROM; no JVD; normal carotid impulses w/o bruits; no thyromegaly or nodules palpated; no lymphadenopathy.  RESP  Bibasilar crackles, , no accessory muscle use, no dullness to percussion  CARD:  RRR, no m/r/g  , 1-2+ pitting edema, pulses intact, no cyanosis or clubbing.  GI:   Soft & nt; nml bowel sounds; no organomegaly or masses detected.  Musco: Warm bil, no deformities or joint swelling noted.   Neuro: alert, no focal deficits noted.    Skin: Warm  CXR 05/18/2013  Changes of pulmonary fibrosis, significantly more on the left than on the right, stable. No new opacity.      Assessment & Plan:

## 2013-05-18 NOTE — Assessment & Plan Note (Signed)
PE and DVT   Plan  Continue on Xarelto 15mg  Twice daily  Until 10/17 begin Xarelto 20mg  daily  Keep legs elevated.   Please contact office for sooner follow up if symptoms do not improve or worsen or seek emergency care  Follow up Dr. Delford Field  Next week as planned.

## 2013-05-24 ENCOUNTER — Ambulatory Visit (INDEPENDENT_AMBULATORY_CARE_PROVIDER_SITE_OTHER): Payer: Medicare Other | Admitting: Critical Care Medicine

## 2013-05-24 ENCOUNTER — Encounter: Payer: Self-pay | Admitting: Critical Care Medicine

## 2013-05-24 VITALS — BP 116/64 | HR 83 | Temp 98.2°F | Ht 70.0 in | Wt 204.0 lb

## 2013-05-24 DIAGNOSIS — Z23 Encounter for immunization: Secondary | ICD-10-CM

## 2013-05-24 DIAGNOSIS — J9611 Chronic respiratory failure with hypoxia: Secondary | ICD-10-CM

## 2013-05-24 DIAGNOSIS — R0902 Hypoxemia: Secondary | ICD-10-CM

## 2013-05-24 DIAGNOSIS — J8410 Pulmonary fibrosis, unspecified: Secondary | ICD-10-CM

## 2013-05-24 DIAGNOSIS — J9610 Chronic respiratory failure, unspecified whether with hypoxia or hypercapnia: Secondary | ICD-10-CM

## 2013-05-24 NOTE — Progress Notes (Signed)
Subjective:    Patient ID: Joshua Booth, male    DOB: 11/09/1945, 67 y.o.   MRN: [id number]  HPI  67 yo male with known hx of WM with IPF/UIP DLCO <30% predicted.   04/27/13 Acute OV  Complains of WM with IPF/UIP DLCO <30% predicted.  Pt c/o increase SOB w/ activity. Pt saw PCP last Monday and yesterday for swelling in ankles. Pt is scheduled for ECHO tomorrow.  Has trouble with lower extremity swelling , worse for last few weeks. Cant not stand without severe dypsnea. On arrival today O2 sat 73% on 5 l/m.   Was started on Metalazone 5mg  daily last week, weight is down 7 lbs. But no change in leg swelling or dyspnea/DOE.  Also on Lasix 40mg  daily .  Lisinopril HCT was stopped. Last week by PCP .  Currently on prednisone 20mg  daily . Sleeps in recliner.  No cough, fever, hemoptysis , abdominal pain or n/v.  Has water blister/Bullae on right leg that ruptured last week from severe leg swelling.  >admitted   10/14 Post Hospital follow up  Pt admitted 04/27/13 found to have worsening hypoxia. Found to have a Left LE DVT and PE (high prob on VQ scan )  . Started bridge therapy and discharged on  Xarelto. Echo showed EF 55-60%, Gr 1 DD , mild RV dilatation. , PAP 47 . Did have acute diastolic HF , required diuresis.  Pt was readmitted on 10/6  for HC-PNA . Tx w/ abx and discharged on Avelox . Did require brief BIPAP support on admission. Sputum cx neg. Suggested that he have sleep study OP for possible OSA/OHS.   Since discharge he is feeling better. Still very weak with low energy. No hemoptysis, chest pain, orthopnea, fever.   05/24/2013 Chief Complaint  Patient presents with  . 1 wk follow up    Breathing is a little better.  Does have DOE.  Not coughing much.  No wheezing, chest tightness, or chest pain.    Pt got readmitted 9/23 for DVT/PE.  Readmitted 10/6- 10/9 for HCAP.  All c/s no growth   NP saw pt 10/14 Now progressing ok. Less dyspnea.  Pt notes some improvement with deep breath.  No mucus.  sats 91% on 4L Now off all ABX  Review of Systems  Constitutional:   No  weight loss, night sweats,  Fevers, chills,  +fatigue, or  lassitude.  HEENT:   No headaches,  Difficulty swallowing,  Tooth/dental problems, or  Sore throat,                No sneezing, itching, ear ache, nasal congestion, post nasal drip,   CV:  No chest pain,  Orthopnea, PND,  , anasarca, dizziness, palpitations, syncope.   GI  No heartburn, indigestion, abdominal pain, nausea, vomiting, diarrhea, change in bowel habits, loss of appetite, bloody stools.   Resp:    No chest wall deformity  Skin: no rash or lesions.  GU: no dysuria, change in color of urine, no urgency or frequency.  No flank pain, no hematuria   MS:     No back pain.  Psych:  No change in mood or affect. No depression or anxiety.  No memory loss.         Objective:   Physical Exam BP 116/64  Pulse 83  Temp(Src) 98.2 F (36.8 C) (Oral)  Ht 5\' 10"  (1.778 m)  Wt 204 lb (92.534 kg)  BMI 29.27 kg/m2  SpO2 91%  GEN:  A/Ox3; pleasant , NAD,chronically ill appearing male in wheelchair   HEENT:  [place]/AT,  EACs-clear, TMs-wnl, NOSE-clear, THROAT-clear, no lesions, no postnasal drip or exudate noted.   NECK:  Supple w/ fair ROM; no JVD; normal carotid impulses w/o bruits; no thyromegaly or nodules palpated; no lymphadenopathy.  RESP  Bibasilar crackles, , no accessory muscle use, no dullness to percussion  CARD:  RRR, no m/r/g  , 1-2+ pitting edema, pulses intact, no cyanosis or clubbing.  GI:   Soft & nt; nml bowel sounds; no organomegaly or masses detected.  Musco: Warm bil, no deformities or joint swelling noted.   Neuro: alert, no focal deficits noted.    Skin: Warm       Assessment & Plan:   PULMONARY FIBROSIS End stage pulmonary fibrosis. Severe chronic resp failure  Plan Cont low dose steroid Consider OFEV if can access, if not consider hospice care soon High dose flu vaccine   Updated Medication  List Outpatient Encounter Prescriptions as of 05/24/2013  Medication Sig Dispense Refill  . Acetylcysteine (NAC 600) 600 MG CAPS Take by mouth. 2 in am and 1 at bedtime       . atorvastatin (LIPITOR) 40 MG tablet Take 40 mg by mouth at bedtime.        Marland Kitchen azaTHIOprine (IMURAN) 50 MG tablet Take 50 mg by mouth daily.      . B Complex-C-Folic Acid (FOLBEE PLUS) TABS Take 1 tablet by mouth daily.  30 tablet  1  . furosemide (LASIX) 20 MG tablet Take 2 tablets (40 mg total) by mouth 2 (two) times daily.      Marland Kitchen glucosamine-chondroitin 500-400 MG tablet Take 2 tablets by mouth daily.        Marland Kitchen KLOR-CON M20 20 MEQ tablet Take 10 mEq by mouth 2 (two) times daily.       . Multiple Vitamin (MULTIVITAMIN) tablet Take 1 tablet by mouth daily.        . nebivolol (BYSTOLIC) 10 MG tablet Take 1 tablet (10 mg total) by mouth daily.  30 tablet  1  . NITROSTAT 0.4 MG SL tablet Place 0.4 mg under the tongue every 5 (five) minutes as needed for chest pain.       . predniSONE (DELTASONE) 10 MG tablet Take 20 mg by mouth daily.      . Rivaroxaban (XARELTO) 20 MG TABS tablet Take 1 tablet (20 mg total) by mouth daily with supper. Starting 10/17  30 tablet  0  . [DISCONTINUED] moxifloxacin (AVELOX) 400 MG tablet Take 1 tablet (400 mg total) by mouth daily.  7 tablet  0  . [DISCONTINUED] Rivaroxaban (XARELTO) 15 MG TABS tablet Take 1 tablet (15 mg total) by mouth 2 (two) times daily with a meal. For 3 weeks, till 10/16  36 tablet  0   No facility-administered encounter medications on file as of 05/24/2013.

## 2013-05-24 NOTE — Patient Instructions (Signed)
High dose flu vaccine given No other medication changes Return 6 weeks

## 2013-05-25 ENCOUNTER — Inpatient Hospital Stay: Payer: Medicare Other | Admitting: Adult Health

## 2013-05-25 NOTE — Discharge Summary (Signed)
Physician Discharge Summary  Joshua Booth ZOX:[id number] DOB: January 05, 1946 DOA: 04/27/2013  PCP: Gildardo Griffes, MD  Admit date: 04/27/2013 Discharge date: 05/02/2013  Time spent: 45 minutes  Recommendations for Outpatient Follow-up:  1. 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 male  With a hx of pulmonary fibrosis followed by Dr. Delford Field, HTN, HLD, and CAD who presented from his Pulmonologist's office with gradually worsening sob and LE edema x 3 weeks. The patient otherwise feels "great." In the ED, the patient was noted to have increased LLE edema which was positive for DVT. A CXR was unremarkable and initial cardiac enzymes were neg. Given his worsening sob, the patient was transferred to [facility] for further treatment.   Hospital Course:  1. LLE DVT with mod to high probability of PE on VQ scan -transitioned from Heparin/coumadin to Xarelto  -pulm consult appreciated   2. Corpulmonale /R heart failure  -Diuresed with IV lasix, improving, over 7L negative  -transitioned to lasix PO 40mg  q12  -2 D ECHO 9/24 with normal EF 55-60%, grade 1 Diastolic dysfunction, mildly dilated RV and PA pressures , better than expected and improved from 2013  -ECHO last year with normal EF and severe Pulm HTN   3. DYspnea  -improving  -multifactorial from IPF, suspected PE, and R heart failure   4. HTN  -stable   5. H/o CAD  -stable, continue ASA/Bystolic, statin    6. Leukocytosis   -likely reactive, afebrile, improving   7. RLE blister/seeping fluid  -wound RN consult appreciated, improved with diuresis     Consultations:  Pulm Dr.Wright  Discharge Exam: Filed Vitals:   05/02/13 1030  BP: 129/56  Pulse: 101  Temp:   Resp:     General: AAOx3 Cardiovascular: S1S2/RRR Respiratory: CTAB  Discharge Instructions  Discharge Orders   Future Appointments Provider Department Dept Phone   06/28/2013 4:00 PM Storm Frisk, MD [place] [facility] [phone]   Future Orders Complete By Expires   Diet - low sodium heart healthy  As directed    Increase activity slowly  As directed        Medication List    STOP taking these medications       aspirin 325 MG tablet      TAKE these medications       atorvastatin 40 MG tablet  Commonly known as:  LIPITOR  Take 40 mg by mouth at bedtime.     azaTHIOprine 50 MG tablet  Commonly known as:  IMURAN  Take 50 mg by mouth daily.     furosemide 20 MG tablet  Commonly known as:  LASIX  Take 2 tablets (40 mg total) by mouth 2 (two) times daily.     glucosamine-chondroitin 500-400 MG tablet  Take 2 tablets by mouth daily.     KLOR-CON M20 20 MEQ tablet  Generic drug:  potassium chloride SA  Take 10 mEq  by mouth 2 (two) times daily.     multivitamin tablet  Take 1 tablet by mouth daily.     NAC 600 600 MG Caps  Generic drug:  Acetylcysteine  Take by mouth. 2 in am and 1 at bedtime     NITROSTAT 0.4 MG SL tablet  Generic drug:  nitroGLYCERIN  Place 0.4 mg under the tongue every 5 (five) minutes as needed for chest pain.     predniSONE 10 MG tablet  Commonly known as:  DELTASONE  Take 20 mg by mouth daily.     Rivaroxaban 20 MG Tabs tablet  Commonly known as:  XARELTO  Take 1 tablet (20 mg total) by mouth daily with supper. Starting 10/17       No Known Allergies     Follow-up Information   Follow up with Shan Levans, MD. Schedule an appointment as soon as possible for  a visit in 1 week.   Specialty:  Pulmonary Disease   Contact information:   [address] [place] [address] [phone]       Follow up with [facility]. (home health nurse for wound care)    Contact information:   [address] [place] [address] [phone]        The results of significant diagnostics from this hospitalization (including imaging, microbiology, ancillary and laboratory) are listed below for reference.    Significant Diagnostic Studies: Dg Chest 1 View  05/12/2013   CLINICAL DATA:  FOLLOW UP right lower lobe pneumonia, shortness of breath.  EXAM: CHEST - 1 VIEW  COMPARISON:  05/11/2013 and CT chest 06/12/2012. Chest radiograph 04/27/2013.  FINDINGS: Heart size stable. Lungs are low in volume with a coarsened reticular pattern, somewhat patchy and greater on the left. Slight improvement in an ovoid opacity in the right lower lung zone. No definite pleural fluid.  IMPRESSION: 1. Slight improvement in an ovoid density at the right lung base, without complete resolution. Finding appears new from 04/27/2013, favoring an infectious or inflammatory etiology. 2. Advanced pulmonary fibrosis.   Electronically Signed   By: Leanna Battles M.D.   On: 05/12/2013 15:34   Dg Chest 2 View  05/18/2013   CLINICAL DATA:  Pulmonary fibrosis  EXAM: CHEST  2 VIEW  COMPARISON:  May 13, 2013 and April 27, 2013  FINDINGS: There is persistent elevation the right hemidiaphragm. There is widespread fibrotic change throughout the left lung with some sparing in the left upper lobe near the apex. Patchy opacity on the right probably represents fibrosis is well. There is no new opacity.  Heart size and pulmonary vascularity are normal. No adenopathy. There are no bone lesions.  IMPRESSION: Changes of pulmonary fibrosis, significantly more on the left than on the right, stable. No new opacity.   Electronically Signed   By: Bretta Bang M.D.   On: 05/18/2013  09:53   Dg Abd 1 View  04/27/2013   *RADIOLOGY REPORT*  Clinical Data: Evaluate for free air  ABDOMEN - 1 VIEW  Comparison: Chest radiograph - earlier same day  Findings:  There are irregular loculated lucencies within the right upper abdominal quadrant as demonstrated on chest radiograph performed earlier same day.  Paucity of bowel gas without definite evidence of obstruction.  No definite pneumatosis or portal venous gas.  Limited visualization of the lower thorax demonstrates coarse reticular opacities within the imaged left mid and lower lung.  Multilevel thoracolumbar spine degenerative change.  IMPRESSION: 1.  Indeterminate irregular  loculated lucencies in the right upper abdominal quadrant are favored to represent either air within an interposed loop of bowel versus incomplete aeration of the right lung base.  If clinical concern persists for pneumoperitoneum, further evaluation with either the left lateral decubitus abdominal radiographs or abdominal CT is recommended. 2.  Suspected advanced fibrotic change within the imaged left mid and lower lung.  This was made a call report.   Original Report Authenticated By: Tacey Ruiz, MD   US Renal  05/12/2013   CLINICAL DATA:  Impaired renal function.  EXAM: RENAL/URINARY TRACT ULTRASOUND COMPLETE  COMPARISON:  None.  FINDINGS: Right Kidney  Length: 10.8 cm. Echogenicity within normal limits. No mass or hydronephrosis visualized.  Left Kidney  Length: 10.6 cm. Echogenicity within normal limits. No mass or hydronephrosis visualized.  Bladder:  Appears normal for degree of bladder distention.  IMPRESSION: Normal exam.   Electronically Signed   By: Geanie Cooley M.D.   On: 05/12/2013 17:08   Nm Pulmonary Perf And Vent  04/27/2013   CLINICAL DATA:  Shortness of breath.  EXAM: NUCLEAR MEDICINE VENTILATION - PERFUSION LUNG SCAN  TECHNIQUE: Ventilation images were obtained in multiple projections using inhaled aerosol technetium 99 M DTPA. Perfusion images were  obtained in multiple projections after intravenous injection of Tc-32m MAA.  COMPARISON:  Plain film of the chest 04/27/2013 at 10:57 a.m.  RADIOPHARMACEUTICALS:  6 mCi Tc-78m DTPA aerosol and 40 mCi Tc-60m MAA  FINDINGS: Ventilation: Inhalation imaging is severely limited due to technical factors.  Perfusion: Wedge-shaped, subsegmental peripheral defects are seen in the right upper lobe and left upper lobe.  IMPRESSION: Limited study due to nondiagnostic inhalation imaging is intermediate to high probability for pulmonary embolus. Critical Value/emergent results were called by telephone at the time of interpretation on 04/27/2013 at 9:35 PMto Marissa, the patient's nurse , who verbally acknowledged these results.   Electronically Signed   By: Drusilla Kanner M.D.   On: 04/27/2013 21:36   US Venous Img Lower Bilateral  04/27/2013   *RADIOLOGY REPORT*  Clinical Data: Shortness of breath and left greater than right lower extremity swelling  BILATERAL LOWER EXTREMITY VENOUS DUPLEX ULTRASOUND  Technique:  Gray-scale sonography with graded compression, as well as color Doppler and duplex ultrasound, were performed to evaluate the deep venous system of both lower extremities from the level of the common femoral vein through the popliteal and proximal calf veins.  Spectral Doppler was utilized to evaluate flow at rest and with distal augmentation maneuvers.  Comparison:  None.  Findings:  Normal compressibility of bilateral common femoral, superficial femoral, and popliteal veins is demonstrated.  No filling defects to suggest DVT on grayscale or color Doppler imaging.  Doppler waveforms show normal direction of venous flow, normal respiratory phasicity and response to augmentation. Bilateral great saphenous veins are patent and compressible.  However, the right peroneal veins are noncompressible and internal flow cannot be demonstrated concerning for focal calf DVT. The right posterior tibial veins are patent and  compressible.  There is superficial edema in the left lower extremity limiting evaluation of the calf veins.  IMPRESSION:  1.  Incompressibility of the right peroneal vein concerning for focal calf DVT.  The posterior tibial veins, and popliteal and proximal veins remain patent.  2.  Edema in the superficial soft tissues limits evaluation of the left calf veins.  No evidence of left DVT to the popliteal level.  These results were called by telephone on 04/27/2013 at 12:40 p.m. to Dr.  Laurey Arrow, who verbally acknowledged these results.   Original Report Authenticated By: Malachy Moan, M.D.   Dg Chest Port 1 View  05/13/2013   CLINICAL DATA:  Respiratory failure. Shortness of breath.  EXAM: PORTABLE CHEST - 1 VIEW  COMPARISON:  05/12/2013  FINDINGS: Elevated right hemidiaphragm. Colonic interposition of bowel limiting evaluation for detection of free intraperitoneal air.  Cardiomegaly.  Calcified tortuous aorta.  Asymmetric airspace disease with similar appearance to the prior examination. Some of the findings represent changes of pulmonary fibrosis. Patchy consolidation right lung base. Recommend followup until clearance.  IMPRESSION: No significant change. Please see above.   Electronically Signed   By: Bridgett Larsson M.D.   On: 05/13/2013 07:32   Dg Chest Port 1 View  05/11/2013   CLINICAL DATA:  Shortness of breath, airspace disease, history hypertension, coronary disease, CHF, pulmonary fibrosis  EXAM: PORTABLE CHEST - 1 VIEW  COMPARISON:  Portable exam 0603 hr compared to 05/10/2013  FINDINGS: Enlargement of cardiac silhouette.  Calcified tortuous aorta.  Diffuse interstitial infiltrates at the mid to lower lungs bilaterally greater on the left compatible with history of pulmonary fibrosis.  Area of more focal opacity at the right lung base is perhaps minimally smaller.  Persistent elevation of right diaphragm.  No gross pleural effusion or pneumothorax.  IMPRESSION: Enlargement of cardiac  silhouette.  Bibasilar pulmonary fibrosis.  Persistent superimposed opacity at the lower right lung, perhaps slightly smaller versus previous exam, consistent with pneumonia.   Electronically Signed   By: Ulyses Southward M.D.   On: 05/11/2013 08:05   Dg Chest Port 1 View  05/10/2013   CLINICAL DATA:  Acute respiratory failure  EXAM: PORTABLE CHEST - 1 VIEW  COMPARISON:  05/10/2013  FINDINGS: Cardiomediastinal silhouette is stable. Persistent nodular opacity right lower lobe. Stable scarring and fibrotic changes left lower lobe. No pulmonary edema.  IMPRESSION: Persistent nodular opacity right lower lobe. Stable scarring in left lower lobe.   Electronically Signed   By: Natasha Mead M.D.   On: 05/10/2013 16:13   Dg Chest Portable 1 View  04/27/2013   CLINICAL DATA:  Shortness of breath. Bilateral lower extremity swelling. Low oxygen saturations. History of hypertension, pulmonary fibrosis, coronary heart disease.  EXAM: PORTABLE CHEST - 1 VIEW  COMPARISON:  04/14/2007  FINDINGS: Shallow lung inflation. Heart size is difficult to evaluate because of significant pulmonary density. There has been progression of fibrotic change within the lung bases, left greater than right.  The right hemidiaphragm is elevated. There is question of free intraperitoneal air beneath the diaphragm. Alternatively, the findings could be related to an interposed loop of colon beneath the right hemidiaphragm. If there is also clinical concern regarding possible bowel perforation, further evaluation with left lateral decubitus view of the abdomen would be suggested.  IMPRESSION: 1. Progression of fibrotic changes bilaterally, left greater than right. 2. Question of free intraperitoneal air beneath right hemidiaphragm versus interposed loop of colon. See above.  These results will be called to the ordering clinician or representative by the Radiologist Assistant, and communication documented in the PACS Dashboard.   Electronically Signed   By:  Rosalie Gums M.D.   On: 04/27/2013 11:30   Dg Abd Decub  04/27/2013   CLINICAL DATA:  Question of free air. Shortness of breath. Leg swelling.  EXAM: ABDOMEN - 1 VIEW DECUBITUS  COMPARISON:  04/27/2013  FINDINGS: Bowel gas pattern is unremarkable. With the patient in left lateral decubitus position, there is no evidence for free  intraperitoneal air. Coarse honeycomb changes are identified at the lung bases.  IMPRESSION: No evidence for free intraperitoneal air.   Electronically Signed   By: Rosalie Gums M.D.   On: 04/27/2013 13:39    Microbiology: No results found for this or any previous visit (from the past 240 hour(s)).   Labs: Basic Metabolic Panel: No results found for this basename: NA, K, CL, CO2, GLUCOSE, BUN, CREATININE, CALCIUM, MG, PHOS,  in the last 168 hours Liver Function Tests: No results found for this basename: AST, ALT, ALKPHOS, BILITOT, PROT, ALBUMIN,  in the last 168 hours No results found for this basename: LIPASE, AMYLASE,  in the last 168 hours No results found for this basename: AMMONIA,  in the last 168 hours CBC: No results found for this basename: WBC, NEUTROABS, HGB, HCT, MCV, PLT,  in the last 168 hours Cardiac Enzymes: No results found for this basename: CKTOTAL, CKMB, CKMBINDEX, TROPONINI,  in the last 168 hours BNP: BNP (last 3 results)  Recent Labs  04/27/13 1105 05/12/13 0320  PROBNP 193.8* 1881.0*   CBG: No results found for this basename: GLUCAP,  in the last 168 hours     Signed:  Gabi Mcfate  [facility] 05/25/2013, 2:48 PM

## 2013-05-25 NOTE — Assessment & Plan Note (Signed)
End stage pulmonary fibrosis. Severe chronic resp failure  Plan Cont low dose steroid Consider OFEV if can access, if not consider hospice care soon High dose flu vaccine

## 2013-05-31 ENCOUNTER — Other Ambulatory Visit: Payer: Self-pay | Admitting: Critical Care Medicine

## 2013-06-11 ENCOUNTER — Telehealth: Payer: Self-pay | Admitting: Critical Care Medicine

## 2013-06-11 MED ORDER — RIVAROXABAN 20 MG PO TABS: 20.0000 mg | ORAL_TABLET | Freq: Every day | ORAL | Status: AC

## 2013-06-11 NOTE — Telephone Encounter (Signed)
Called and spoke with pt and he is aware of refill of the xarelto that has been sent in to his pharmacy.  Pt is aware of PW recs and i will forward this message to crystal to follow up on the pirfenidone rx.

## 2013-06-11 NOTE — Telephone Encounter (Signed)
Ok for refill  As a matter of fact tell the pt we will proceed with pirfenidone RX  Crystal: pls start process to get this pt pirfenidone RX  We will work on this in [place] next week

## 2013-06-11 NOTE — Telephone Encounter (Signed)
I spoke with pt. He reports he has 1 week left of xarelto. He reports he takes 20 mg daily. He also wants to know if the Dr. Delford Field is going to be prescribing the new PF medication to him since it is now available. Pt aware Dr. Delford Field will be in next week. Please advise thanks

## 2013-06-14 ENCOUNTER — Other Ambulatory Visit: Payer: Self-pay | Admitting: Critical Care Medicine

## 2013-06-14 NOTE — Telephone Encounter (Signed)
I spoke with Dr. Delford Field regarding setting pt up on pirfenidone.  This medication will need to be received from a specialty pharmacy.  To start the process, a form will need to be completed.  PCCs have this form.  I called, spoke with Bjorn Loser as I am in [place] office today.  Bjorn Loser will fax this form to me.  Will await fax.

## 2013-06-15 NOTE — Telephone Encounter (Signed)
Form completed and has been signed by Dr. Delford Field.  I have given this to [place] to look over and help with this process.  Will route msg to her to f/u on once sent to Pharm.  Also, do we need to have pt sign a form to send in?

## 2013-06-15 NOTE — Telephone Encounter (Signed)
Reviewed with this pt and will mail pt a form he needs to fill out and return to me Tobe Sos

## 2013-06-21 ENCOUNTER — Telehealth: Payer: Self-pay | Admitting: Critical Care Medicine

## 2013-06-21 NOTE — Telephone Encounter (Signed)
Spoke to pt will mail him a new form for his meds Joshua Booth

## 2013-06-21 NOTE — Telephone Encounter (Signed)
I spoke with pt. He reports he has to place his income amount on the form. He reports the amount on the form is not what he is actually getting a year. He is concerned about this. He has already mailed [place] the forms back for the pirfenidone medication. He is wanting to speak to [facility] to see what he can do about this. Please advise thanks

## 2013-06-24 ENCOUNTER — Other Ambulatory Visit: Payer: Self-pay | Admitting: Critical Care Medicine

## 2013-06-24 NOTE — Telephone Encounter (Signed)
Imuran rx was sent to [facility] on 05/31/13 # 30 x 3. Called, spoke with [place] with Pharm.  Was advised they did receive rx from Oct.   Nothing further is needed. Will sign off.

## 2013-06-28 ENCOUNTER — Ambulatory Visit: Payer: Medicare Other | Admitting: Critical Care Medicine

## 2013-07-06 ENCOUNTER — Ambulatory Visit: Payer: Medicare Other | Admitting: Critical Care Medicine

## 2013-07-20 ENCOUNTER — Ambulatory Visit (INDEPENDENT_AMBULATORY_CARE_PROVIDER_SITE_OTHER): Payer: Medicare Other | Admitting: Critical Care Medicine

## 2013-07-20 ENCOUNTER — Encounter: Payer: Self-pay | Admitting: Critical Care Medicine

## 2013-07-20 VITALS — BP 146/86 | HR 78 | Temp 98.5°F | Ht 70.0 in | Wt 210.5 lb

## 2013-07-20 DIAGNOSIS — J8410 Pulmonary fibrosis, unspecified: Secondary | ICD-10-CM

## 2013-07-20 NOTE — Patient Instructions (Signed)
No change in medications. Return in    1 month We will be checking on your status for the pirfenidone

## 2013-07-20 NOTE — Assessment & Plan Note (Signed)
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

## 2013-07-20 NOTE — Progress Notes (Signed)
Subjective:    Patient ID: Joshua Booth, male    DOB: 1946-04-27, 67 y.o.   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   07/20/2013 Chief Complaint  Patient presents with  . 6 wk follow up    Breathing is unchanged.  DOE is at baseline.  No wheezing, chest tightness/pain, or cough at this time.  No real chest pain.  No repeat hosp stay since 05/2013. Leg sore has healed up.    Review of Systems  Constitutional:   No  weight loss, night sweats,  Fevers, chills,  +fatigue, or  lassitude.  HEENT:   No headaches,  Difficulty swallowing,  Tooth/dental problems, or  Sore throat,                No sneezing, itching, ear ache, nasal congestion, post nasal drip,   CV:  No chest pain,  Orthopnea, PND,  , anasarca, dizziness, palpitations, syncope.   GI  No heartburn, indigestion, abdominal pain, nausea, vomiting, diarrhea, change in bowel habits, loss of appetite, bloody stools.   Resp:    No chest wall deformity  Skin: no rash or lesions.  GU: no dysuria, change in color of urine, no urgency or frequency.  No flank pain, no hematuria   MS:     No back pain.  Psych:  No change in mood or affect. No depression or anxiety.  No memory loss.         Objective:   Physical Exam BP 146/86  Pulse 78  Temp(Src) 98.5 F (36.9 C) (Oral)  Ht 5\' 10"  (1.778 m)  Wt 210 lb 8 oz (95.482 kg)  BMI 30.20 kg/m2  SpO2 93%  GEN: A/Ox3; pleasant , NAD,chronically ill appearing male in wheelchair   HEENT:  [place]/AT,  EACs-clear, TMs-wnl, NOSE-clear, THROAT-clear, no lesions, no postnasal drip or exudate noted.   NECK:  Supple w/ fair ROM; no JVD; normal carotid impulses w/o bruits; no thyromegaly or nodules palpated; no lymphadenopathy.  RESP  Bibasilar crackles, , no accessory muscle use, no dullness to percussion  CARD:  RRR, no m/r/g  , 1-2+ pitting edema, pulses intact, no cyanosis or clubbing.  GI:   Soft & nt; nml bowel sounds; no  organomegaly or masses detected.  Musco: Warm bil, no deformities or joint swelling noted.   Neuro: alert, no focal deficits noted.    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 Acetylcysteine (NAC 600) 600 MG CAPS Take by mouth. 2 in am and 1 at bedtime   . atorvastatin (LIPITOR) 40 MG tablet Take 40 mg by mouth at bedtime.    Marland Kitchen azaTHIOprine (IMURAN) 50 MG tablet TAKE 1 TABLET BY MOUTH EVERY DAY  . B Complex-C-Folic Acid (FOLBEE PLUS) TABS Take 1 tablet by mouth daily.  . furosemide (LASIX) 20 MG tablet Take 2 tablets (40 mg total) by mouth 2 (two) times daily.  Marland Kitchen glucosamine-chondroitin 500-400 MG tablet Take 2 tablets by mouth daily.    Marland Kitchen KLOR-CON M20 20 MEQ tablet Take 10 mEq by mouth 2 (two) times daily.   . Multiple Vitamin (MULTIVITAMIN) tablet Take 1 tablet by mouth  daily.    . nebivolol (BYSTOLIC) 10 MG tablet Take 1 tablet (10 mg total) by mouth daily.  Marland Kitchen NITROSTAT 0.4 MG SL tablet Place 0.4 mg under the tongue every 5 (five) minutes as needed for chest pain.   . predniSONE (DELTASONE) 10 MG tablet Take 20 mg by mouth daily.  . Rivaroxaban (XARELTO) 20 MG TABS tablet Take 1 tablet (20 mg total) by mouth daily with supper. Starting 10/17  . [DISCONTINUED] azaTHIOprine (IMURAN) 50 MG tablet Take 50 mg by mouth daily.

## 2013-07-21 ENCOUNTER — Other Ambulatory Visit: Payer: Self-pay | Admitting: Adult Health

## 2013-07-21 MED ORDER — NEBIVOLOL HCL 10 MG PO TABS: 10.0000 mg | ORAL_TABLET | Freq: Every day | ORAL | Status: AC

## 2013-07-21 NOTE — Telephone Encounter (Signed)
Received faxed refill request from [facility] for Bystolic 5mg  #60, 2 tabs by mouth once daily  Last refill was 10.14.14 by TP for Bystolic 10mg  #30, 1 by mouth once daily with 1 additional refill Will okay x1, with note to defer future refill requests to patient's PCP

## 2013-07-28 ENCOUNTER — Telehealth: Payer: Self-pay | Admitting: Orthopedic Surgery

## 2013-07-28 NOTE — Telephone Encounter (Signed)
Called and spoke with the pt  He is asking if needs to stop pred and imuran when starts perfenidone  I advised per last ov note, to not make any changes until next ov  Pt verbalized understanding and states nothing further needed

## 2013-07-31 ENCOUNTER — Other Ambulatory Visit: Payer: Self-pay | Admitting: Critical Care Medicine

## 2013-08-02 ENCOUNTER — Telehealth: Payer: Self-pay | Admitting: Critical Care Medicine

## 2013-08-02 MED ORDER — AZATHIOPRINE 50 MG PO TABS: ORAL_TABLET | ORAL | Status: AC

## 2013-08-02 NOTE — Telephone Encounter (Signed)
Imuran 50 mg rx was sent to [facility] on 05/31/13 # 30 x 3. Called Pharm, spoke with Thayer Ohm.  They did receive the Imuran rx from Oct 2014.  Nothing further is needed.

## 2013-08-02 NOTE — Telephone Encounter (Signed)
I called and spoke with pt. Aware refill has been sent. Nothing further needed

## 2013-08-04 NOTE — Progress Notes (Signed)
Late entry for missing G code for 05/25/2013  08-17-2013 1300  PT G-Codes **NOT FOR INPATIENT CLASS**  Functional Assessment Tool Used clinical judgment  Functional Limitation Mobility: Walking and moving around  Mobility: Walking and Moving Around Current Status [phone]) CJ  Mobility: Walking and Moving Around Goal Status [phone]) CI  [facility] [phone] (pager)

## 2013-08-06 ENCOUNTER — Telehealth: Payer: Self-pay | Admitting: Critical Care Medicine

## 2013-08-06 NOTE — Telephone Encounter (Signed)
Noted  This not due to pirfenidone This is his ILD end stage process

## 2013-08-06 NOTE — Telephone Encounter (Signed)
Called and spoke with pts son.  He stated that the pt started the perfinadone about 5-6 days ago.  pts son stated that for the last 3 days pt has been having congestion, has a hard time staying awake and pt has been disoriented and pt fell last night and banged up his arms.  pts son stated that he has his arms wrapped up now, but he is concerned since the pt has the disorientation and his oxygen is set on 7 liters and sats are at 90%.  He stated that he spoke with his sister who is a Char[employer] fundraiserN, and he is going to call 911 and have pt transported to [facility] and then will have the pt transferred to [facility].  pts son is aware that I will forward this message to PW to make him aware.   Nothing further is needed.

## 2013-08-09 NOTE — Progress Notes (Signed)
Late entry due to missed g-code.  08/09/13 0800  OT G-codes **NOT FOR INPATIENT CLASS**  Functional Assessment Tool Used clinical judgment  Functional Limitation Self care  Self Care Current Status [phone](G8987) CI  Self Care Goal Status ([id number](G8988) CI  Self Care Discharge Status [phone](G8989) CI  08/09/2013 Martie RoundJulie Heliodoro Domagalski, OTR/L Pager: [phone]

## 2013-08-10 NOTE — Progress Notes (Signed)
PT evaluation addendum Late entry - g codes   04/29/13 1234  PT Time Calculation  PT Start Time 1127  PT Stop Time 1157  PT Time Calculation (min) 30 min  PT G-Codes **NOT FOR INPATIENT CLASS**  Functional Assessment Tool Used clinical judgement/chart review  Functional Limitation Mobility: Walking and moving around  Mobility: Walking and Moving Around Current Status [phone](G8978) CI  Mobility: Walking and Moving Around Goal Status ([id number](G8979) CI  PT General Charges  $$ ACUTE PT VISIT 1 Procedure  PT Evaluation  $Initial PT Evaluation Tier I 1 Procedure  PT Treatments  $Gait Training 8-22 mins  $Therapeutic Activity 8-22 mins  08/10/2013 Corlis HoveMargie Xayvier Vallez, PT [phone]

## 2013-08-12 ENCOUNTER — Telehealth: Payer: Self-pay | Admitting: Critical Care Medicine

## 2013-08-12 NOTE — Telephone Encounter (Signed)
I called and spoke with pt son. Made him aware of the [phone] pager # and will have this done. Nothing further needed

## 2013-08-12 NOTE — Telephone Encounter (Signed)
I spoke with pt son. He reports pt is currently admitted to [facility]. He reports the pt has the flu. Pt was placed on BIPAP Friday-Sunday. Per son pt is not acting like himself. He [facility] were [facility] docs [facility] him. Dr. Delford FieldWright please advise thanks

## 2013-08-12 NOTE — Telephone Encounter (Signed)
He needs to get the docs to call us page the [phone] pager to discuss the case for this to happen

## 2013-08-13 ENCOUNTER — Inpatient Hospital Stay
Admission: AD | Admit: 2013-08-13 | Discharge: 2013-09-05 | Disposition: E | Payer: Self-pay | Source: Ambulatory Visit | Attending: Internal Medicine | Admitting: Internal Medicine

## 2013-08-14 ENCOUNTER — Other Ambulatory Visit (HOSPITAL_COMMUNITY): Payer: Self-pay

## 2013-08-14 LAB — CBC
HCT: 32.2 % — ABNORMAL LOW (ref 39.0–52.0)
Hemoglobin: 10 g/dL — ABNORMAL LOW (ref 13.0–17.0)
MCH: 29.1 pg (ref 26.0–34.0)
MCHC: 31.1 g/dL (ref 30.0–36.0)
MCV: 93.6 fL (ref 78.0–100.0)
Platelets: 242 10*3/uL (ref 150–400)
RBC: 3.44 MIL/uL — AB (ref 4.22–5.81)
RDW: 14.1 % (ref 11.5–15.5)
WBC: 9.7 10*3/uL (ref 4.0–10.5)

## 2013-08-14 LAB — URINALYSIS, ROUTINE W REFLEX MICROSCOPIC
Bilirubin Urine: NEGATIVE
Glucose, UA: NEGATIVE mg/dL
Hgb urine dipstick: NEGATIVE
Ketones, ur: NEGATIVE mg/dL
LEUKOCYTES UA: NEGATIVE
Nitrite: NEGATIVE
PROTEIN: NEGATIVE mg/dL
Specific Gravity, Urine: 1.014 (ref 1.005–1.030)
UROBILINOGEN UA: 0.2 mg/dL (ref 0.0–1.0)
pH: 6.5 (ref 5.0–8.0)

## 2013-08-14 LAB — COMPREHENSIVE METABOLIC PANEL
ALT: 27 U/L (ref 0–53)
AST: 30 U/L (ref 0–37)
Albumin: 2.9 g/dL — ABNORMAL LOW (ref 3.5–5.2)
Alkaline Phosphatase: 56 U/L (ref 39–117)
BUN: 29 mg/dL — ABNORMAL HIGH (ref 6–23)
CALCIUM: 9 mg/dL (ref 8.4–10.5)
Chloride: 98 mEq/L (ref 96–112)
Creatinine, Ser: 1.49 mg/dL — ABNORMAL HIGH (ref 0.50–1.35)
GFR, EST AFRICAN AMERICAN: 54 mL/min — AB (ref >90)
GFR, EST NON AFRICAN AMERICAN: 46 mL/min — AB (ref >90)
GLUCOSE: 78 mg/dL (ref 70–99)
Potassium: 4.5 mEq/L (ref 3.7–5.3)
Sodium: 150 mEq/L — ABNORMAL HIGH (ref 137–147)
Total Bilirubin: 0.4 mg/dL (ref 0.3–1.2)
Total Protein: 6 g/dL (ref 6.0–8.3)

## 2013-08-14 LAB — PREALBUMIN: PREALBUMIN: 20.8 mg/dL (ref 17.0–34.0)

## 2013-08-14 LAB — TSH: TSH: 1.152 u[IU]/mL (ref 0.350–4.500)

## 2013-08-14 MED FILL — Medication: Qty: 1 | Status: AC

## 2013-08-31 ENCOUNTER — Ambulatory Visit: Payer: Medicare Other | Admitting: Critical Care Medicine

## 2013-09-05 DEATH — deceased

## 2014-05-09 IMAGING — CR DG CHEST 1V PORT
1 series · 1 of 1 positions shown · non-contrast
Comparison: Portable exam 5259 hr compared to 05/10/2013

CLINICAL DATA: Shortness of breath, airspace disease, history
hypertension, coronary disease, CHF, pulmonary fibrosis

EXAM:
PORTABLE CHEST - 1 VIEW

[AP]
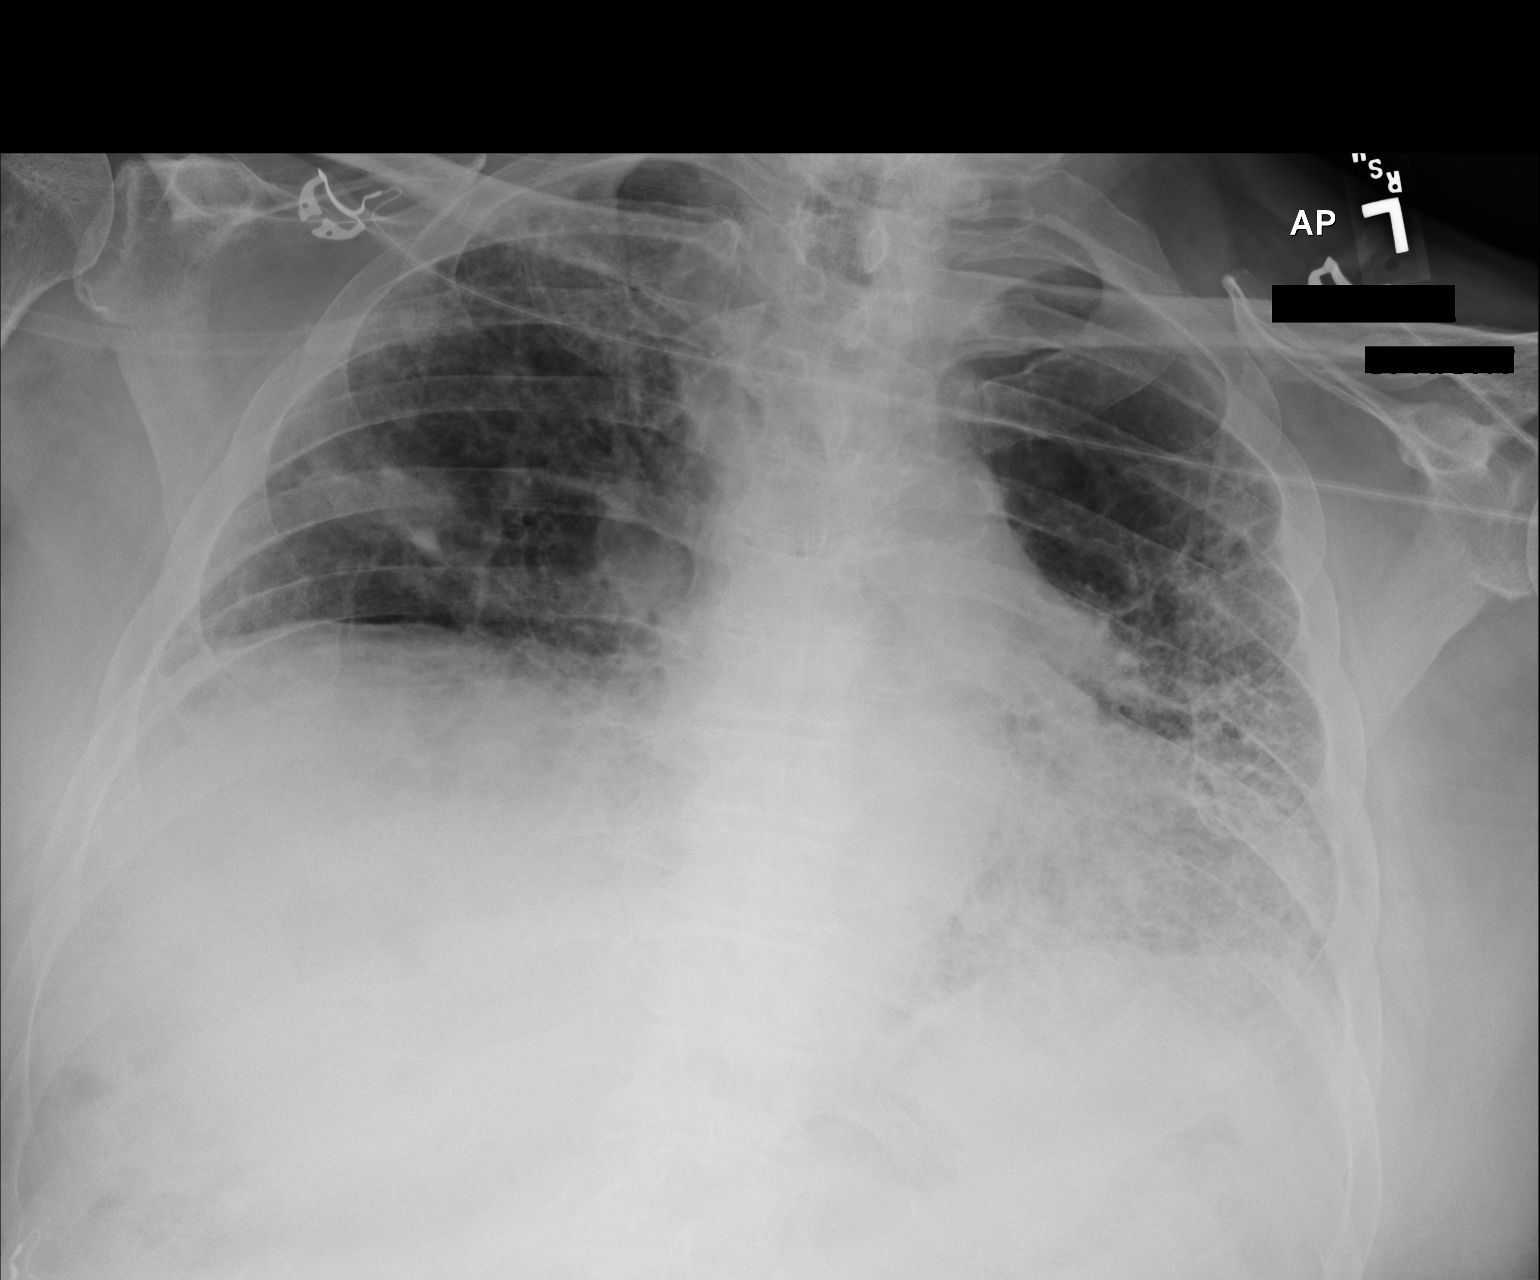

[1 of 1 positions shown; findings below may reference images not displayed]

FINDINGS: Enlargement of cardiac silhouette.

Calcified tortuous aorta.

Diffuse interstitial infiltrates at the mid to lower lungs
bilaterally greater on the left compatible with history of pulmonary
fibrosis.

Area of more focal opacity at the right lung base is perhaps
minimally smaller.

Persistent elevation of right diaphragm.

No gross pleural effusion or pneumothorax.
IMPRESSION: Enlargement of cardiac silhouette.

Bibasilar pulmonary fibrosis.

Persistent superimposed opacity at the lower right lung, perhaps
slightly smaller versus previous exam, consistent with pneumonia.

## 2014-05-10 IMAGING — US US RENAL
1 series · 14 of 25 positions shown · non-contrast
Comparison: None.

CLINICAL DATA: Impaired renal function.

EXAM:
RENAL/URINARY TRACT ULTRASOUND COMPLETE

[Series 1: us renal · 0.27mm/px · 14 of 25 slices shown]
[im 1/25]
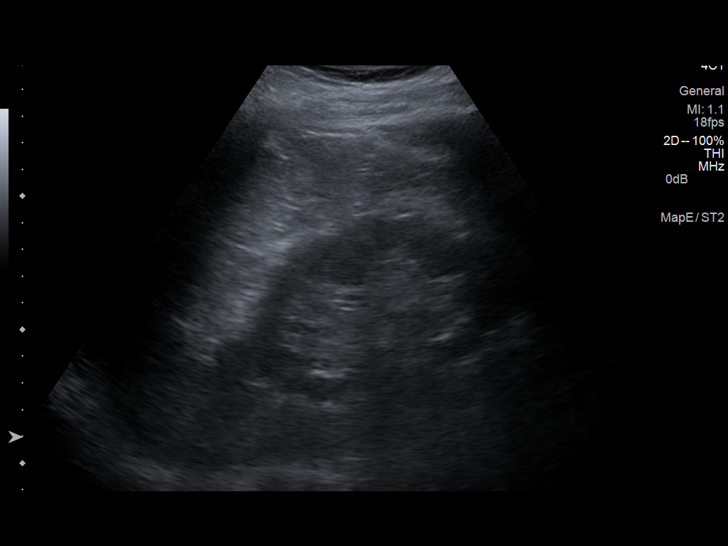
[im 3/25]
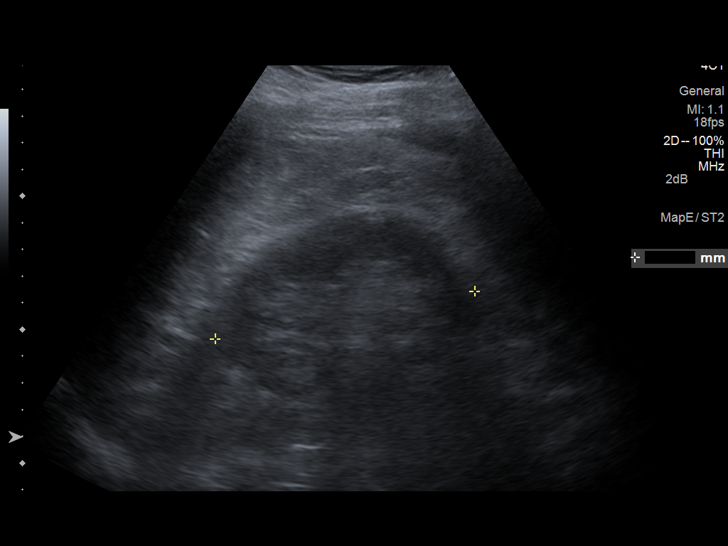
[im 5/25]
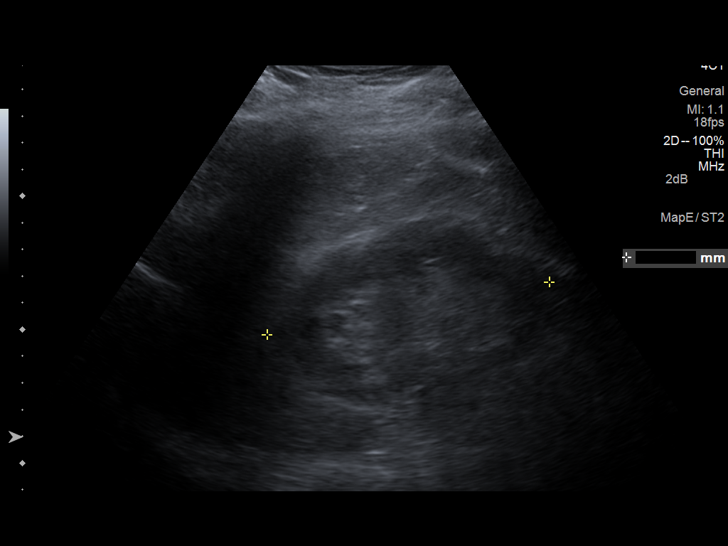
[im 7/25]
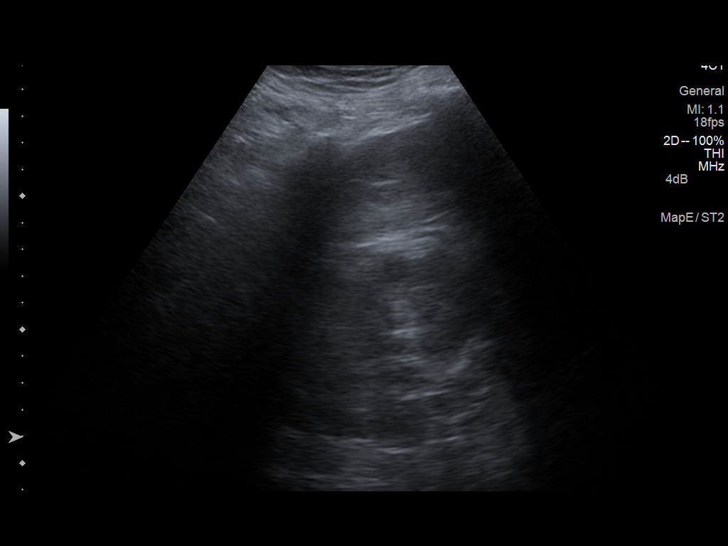
[im 9/25]
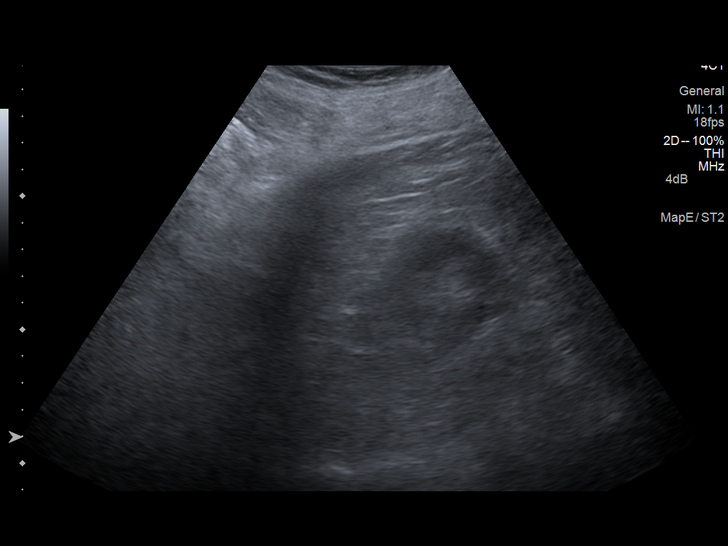
[im 10/25]
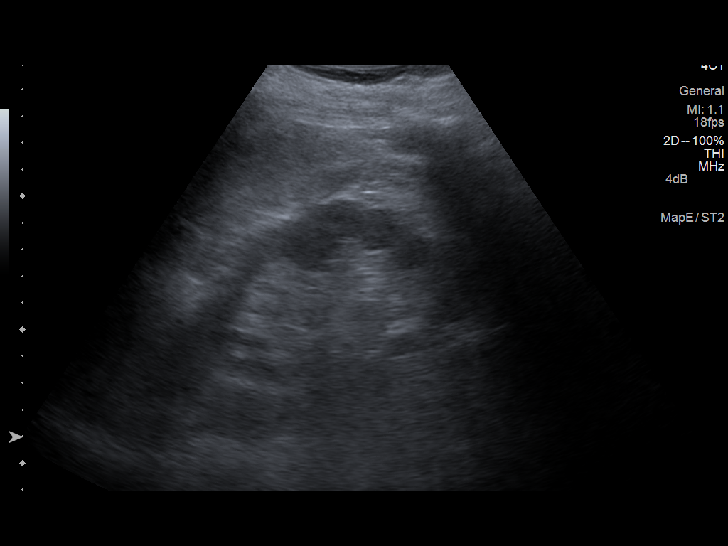
[im 12/25]
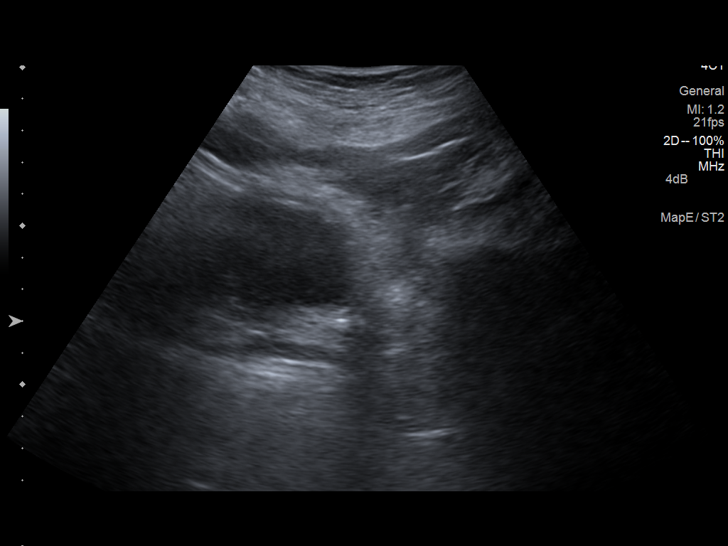
[im 14/25]
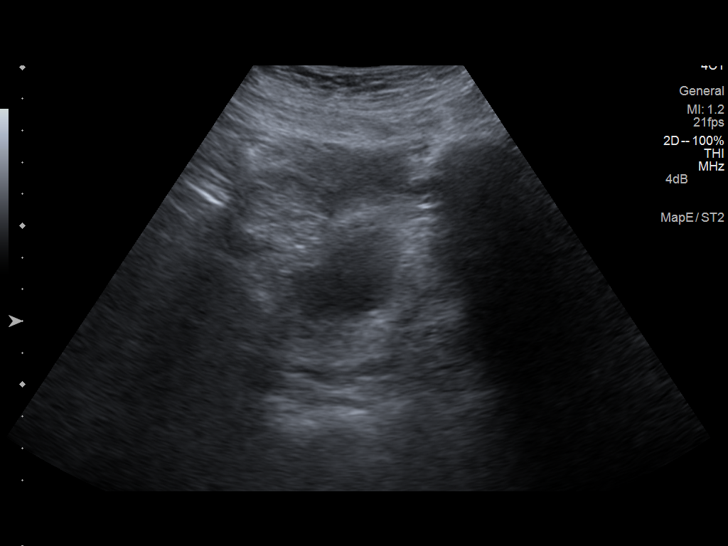
[im 16/25]
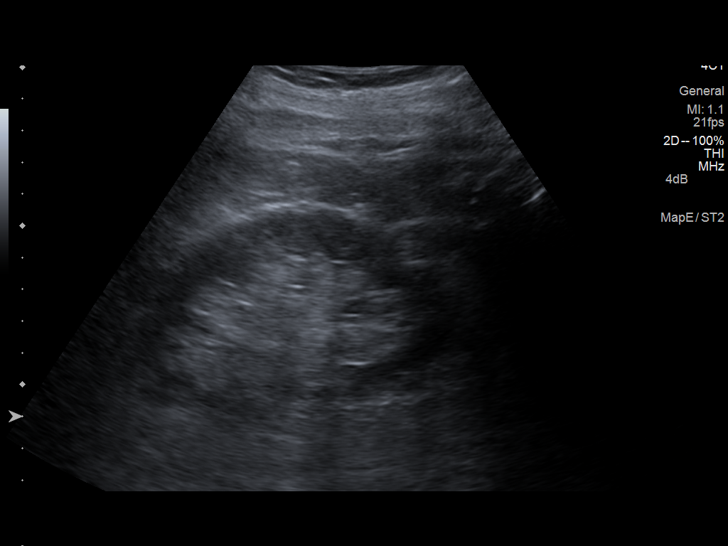
[im 17/25]
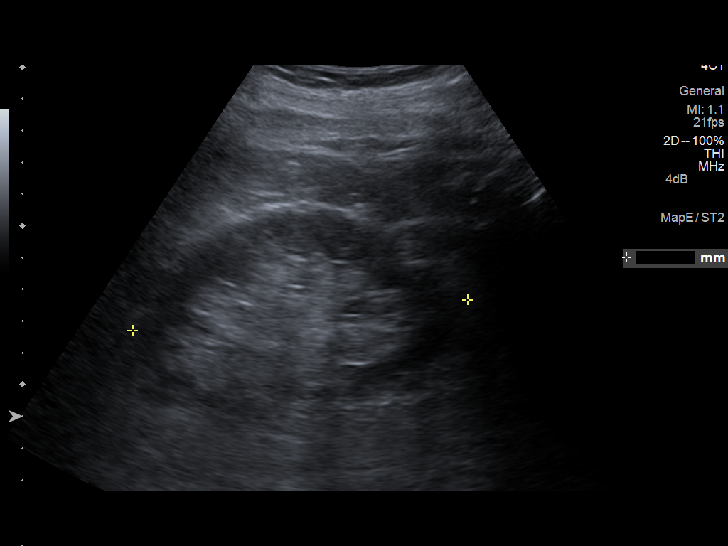
[im 19/25]
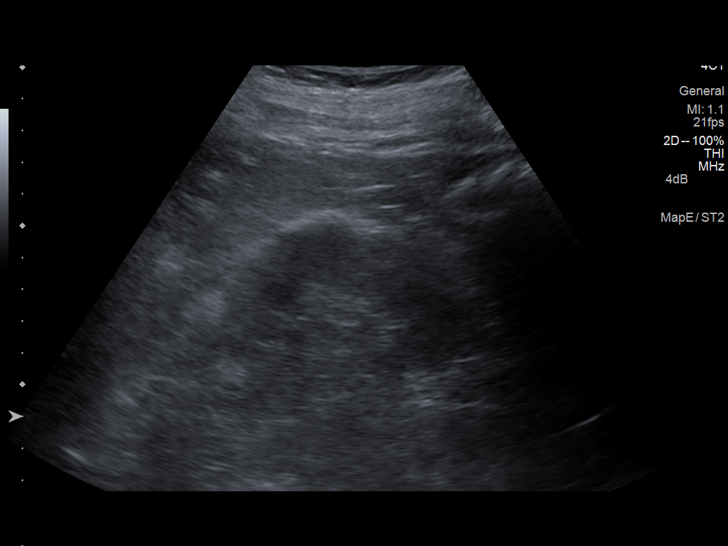
[im 21/25]
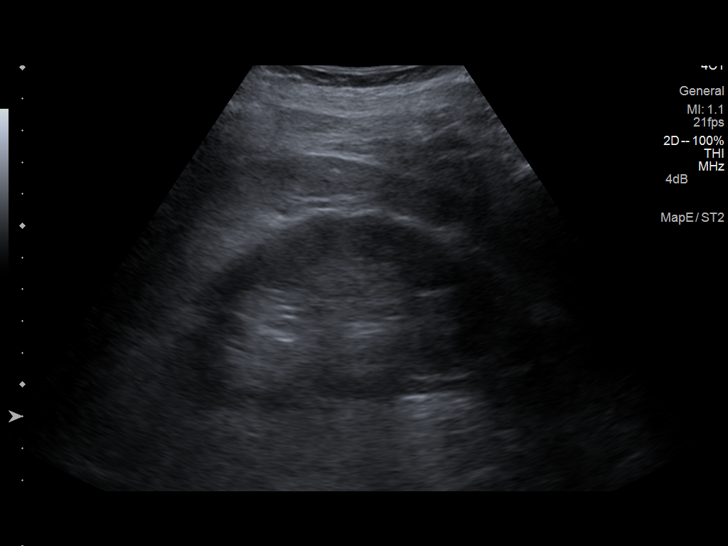
[im 23/25]
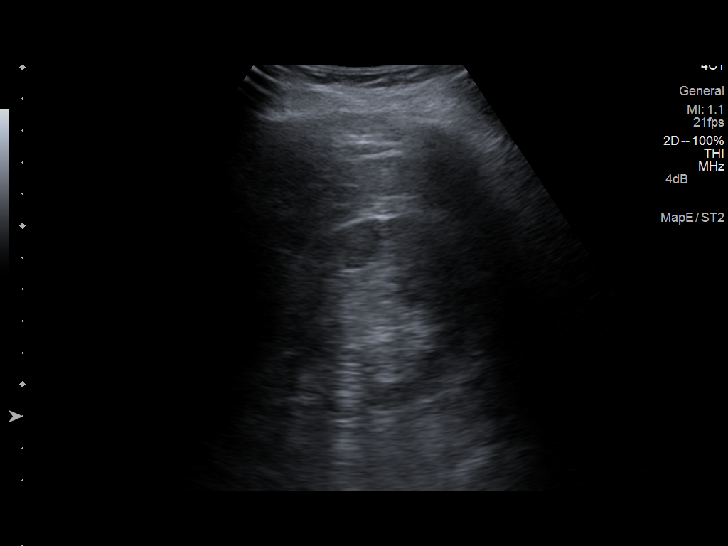
[im 25/25]
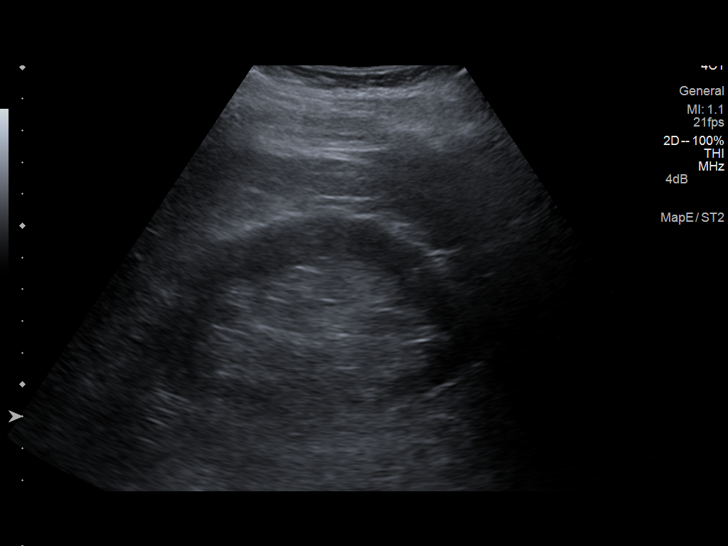

[14 of 25 positions shown; findings below may reference images not displayed]

FINDINGS: Right Kidney

Length: 10.8 cm. Echogenicity within normal limits. No mass or
hydronephrosis visualized.

Left Kidney

Length: 10.6 cm. Echogenicity within normal limits. No mass or
hydronephrosis visualized.

Bladder:  Appears normal for degree of bladder distention.
IMPRESSION: Normal exam.

## 2014-05-10 IMAGING — CR DG CHEST 1V
1 series · 1 of 1 positions shown · non-contrast
Comparison: 05/11/2013 and CT chest 06/12/2012. Chest radiograph
04/27/2013.

CLINICAL DATA: FOLLOW UP right lower lobe pneumonia, shortness of
breath.

EXAM:
CHEST - 1 VIEW

[w chest pa]
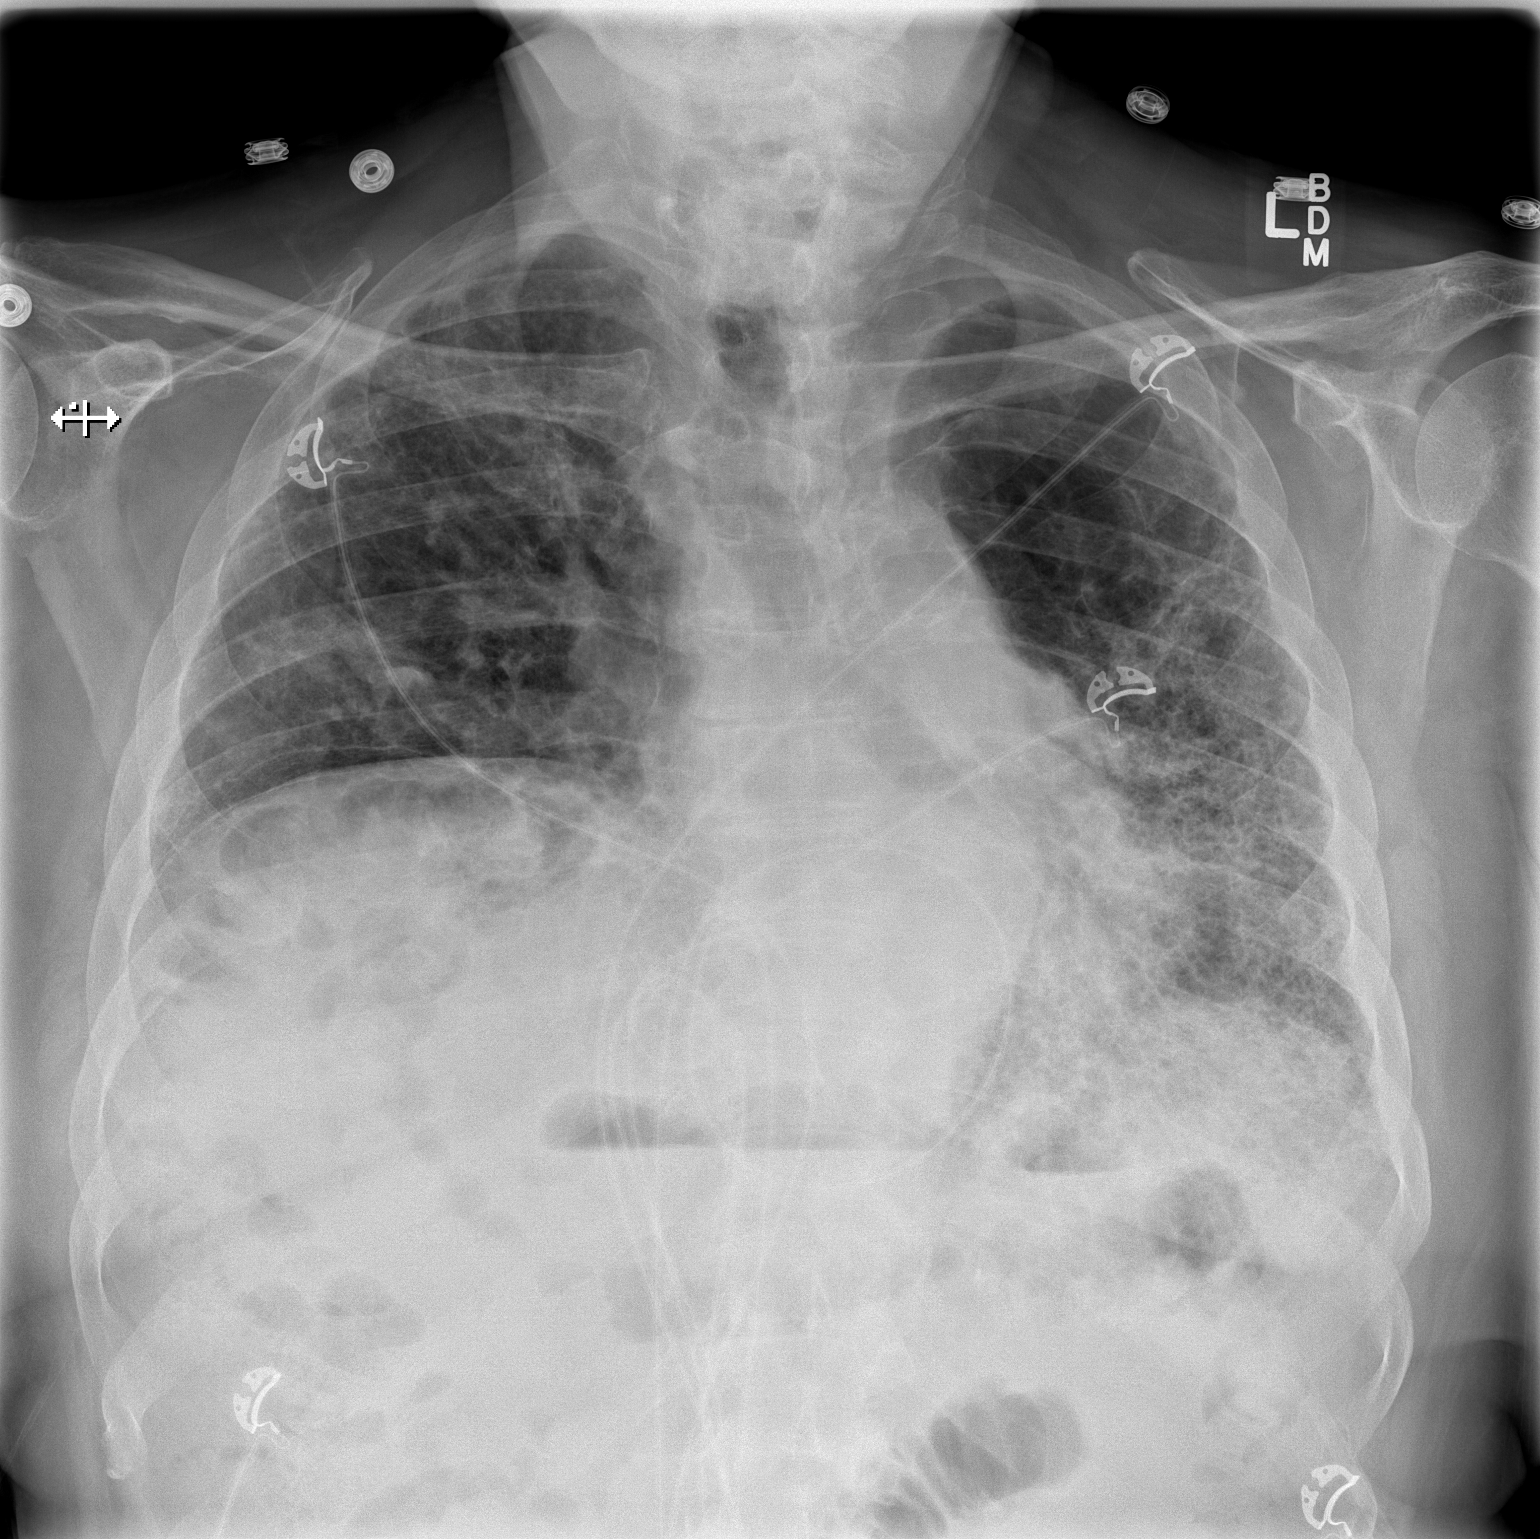

[1 of 1 positions shown; findings below may reference images not displayed]

FINDINGS: Heart size stable. Lungs are low in volume with a coarsened
reticular pattern, somewhat patchy and greater on the left. Slight
improvement in an ovoid opacity in the right lower lung zone. No
definite pleural fluid.
IMPRESSION: 1. Slight improvement in an ovoid density at the right lung base,
without complete resolution. Finding appears new from 04/27/2013,
favoring an infectious or inflammatory etiology.
2. Advanced pulmonary fibrosis.
